# Patient Record
Sex: Female | Born: 1955 | Race: Asian | Hispanic: No | Marital: Married | Smoking: Never smoker
Health system: Southern US, Community
[De-identification: ages and names within clinical notes are randomized; demographics above are authoritative.]

## PROBLEM LIST (undated history)

## (undated) DIAGNOSIS — R059 Cough, unspecified: Secondary | ICD-10-CM

## (undated) DIAGNOSIS — E559 Vitamin D deficiency, unspecified: Secondary | ICD-10-CM

## (undated) DIAGNOSIS — M8580 Other specified disorders of bone density and structure, unspecified site: Secondary | ICD-10-CM

## (undated) DIAGNOSIS — J302 Other seasonal allergic rhinitis: Secondary | ICD-10-CM

## (undated) DIAGNOSIS — R05 Cough: Secondary | ICD-10-CM

## (undated) DIAGNOSIS — T7840XA Allergy, unspecified, initial encounter: Secondary | ICD-10-CM

## (undated) HISTORY — DX: Other seasonal allergic rhinitis: J30.2

## (undated) HISTORY — DX: Cough, unspecified: R05.9

## (undated) HISTORY — DX: Allergy, unspecified, initial encounter: T78.40XA

## (undated) HISTORY — PX: POLYPECTOMY: SHX149

## (undated) HISTORY — DX: Vitamin D deficiency, unspecified: E55.9

## (undated) HISTORY — DX: Other specified disorders of bone density and structure, unspecified site: M85.80

---
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
## 2009-02-13 HISTORY — PX: COLONOSCOPY: SHX174

## 2009-03-18 ENCOUNTER — Encounter: Admission: RE | Admit: 2009-03-18 | Discharge: 2009-03-18 | Payer: Self-pay | Admitting: Family Medicine

## 2009-03-18 DIAGNOSIS — Z0000 Encounter for general adult medical examination without abnormal findings: Secondary | ICD-10-CM | POA: Insufficient documentation

## 2009-03-25 ENCOUNTER — Encounter: Admission: RE | Admit: 2009-03-25 | Discharge: 2009-03-25 | Payer: Self-pay | Admitting: Family Medicine

## 2010-03-06 ENCOUNTER — Encounter: Payer: Self-pay | Admitting: Family Medicine

## 2010-05-13 ENCOUNTER — Other Ambulatory Visit: Payer: Self-pay | Admitting: Family Medicine

## 2010-05-13 DIAGNOSIS — Z1231 Encounter for screening mammogram for malignant neoplasm of breast: Secondary | ICD-10-CM

## 2010-05-27 ENCOUNTER — Ambulatory Visit
Admission: RE | Admit: 2010-05-27 | Discharge: 2010-05-27 | Disposition: A | Discharge status: Home / Self Care | Payer: BC Managed Care – PPO | Source: Ambulatory Visit | Attending: Family Medicine | Admitting: Family Medicine

## 2010-05-27 DIAGNOSIS — Z1231 Encounter for screening mammogram for malignant neoplasm of breast: Secondary | ICD-10-CM

## 2011-06-16 ENCOUNTER — Other Ambulatory Visit: Payer: Self-pay | Admitting: Nurse Practitioner

## 2011-06-16 DIAGNOSIS — Z1231 Encounter for screening mammogram for malignant neoplasm of breast: Secondary | ICD-10-CM

## 2011-06-22 ENCOUNTER — Ambulatory Visit
Admission: RE | Admit: 2011-06-22 | Discharge: 2011-06-22 | Disposition: A | Discharge status: Home / Self Care | Payer: BC Managed Care – PPO | Source: Ambulatory Visit | Attending: Nurse Practitioner | Admitting: Nurse Practitioner

## 2011-06-22 ENCOUNTER — Other Ambulatory Visit: Payer: Self-pay | Admitting: Family Medicine

## 2011-06-22 DIAGNOSIS — Z1231 Encounter for screening mammogram for malignant neoplasm of breast: Secondary | ICD-10-CM

## 2012-07-15 ENCOUNTER — Other Ambulatory Visit: Payer: Self-pay

## 2012-07-15 DIAGNOSIS — Z1231 Encounter for screening mammogram for malignant neoplasm of breast: Secondary | ICD-10-CM

## 2012-08-15 ENCOUNTER — Ambulatory Visit
Admission: RE | Admit: 2012-08-15 | Discharge: 2012-08-15 | Disposition: A | Discharge status: Home / Self Care | Payer: BC Managed Care – PPO | Source: Ambulatory Visit

## 2012-08-15 DIAGNOSIS — Z1231 Encounter for screening mammogram for malignant neoplasm of breast: Secondary | ICD-10-CM

## 2013-09-15 ENCOUNTER — Other Ambulatory Visit: Payer: Self-pay

## 2013-09-15 DIAGNOSIS — Z1231 Encounter for screening mammogram for malignant neoplasm of breast: Secondary | ICD-10-CM

## 2013-09-23 ENCOUNTER — Ambulatory Visit
Admission: RE | Admit: 2013-09-23 | Discharge: 2013-09-23 | Disposition: A | Discharge status: Home / Self Care | Payer: BC Managed Care – PPO | Source: Ambulatory Visit

## 2013-09-23 DIAGNOSIS — Z1231 Encounter for screening mammogram for malignant neoplasm of breast: Secondary | ICD-10-CM

## 2014-08-14 ENCOUNTER — Other Ambulatory Visit: Payer: Self-pay

## 2014-08-14 DIAGNOSIS — Z1231 Encounter for screening mammogram for malignant neoplasm of breast: Secondary | ICD-10-CM

## 2014-09-09 ENCOUNTER — Encounter: Payer: Self-pay | Admitting: Obstetrics and Gynecology

## 2014-09-09 ENCOUNTER — Ambulatory Visit (INDEPENDENT_AMBULATORY_CARE_PROVIDER_SITE_OTHER): Payer: 59 | Admitting: Obstetrics and Gynecology

## 2014-09-09 VITALS — BP 100/60 | HR 84 | Resp 14 | Ht 65.5 in | Wt 127.0 lb

## 2014-09-09 DIAGNOSIS — N393 Stress incontinence (female) (male): Secondary | ICD-10-CM | POA: Diagnosis not present

## 2014-09-09 DIAGNOSIS — IMO0002 Reserved for concepts with insufficient information to code with codable children: Secondary | ICD-10-CM

## 2014-09-09 DIAGNOSIS — Z124 Encounter for screening for malignant neoplasm of cervix: Secondary | ICD-10-CM

## 2014-09-09 DIAGNOSIS — N941 Dyspareunia: Secondary | ICD-10-CM | POA: Diagnosis not present

## 2014-09-09 DIAGNOSIS — Z23 Encounter for immunization: Secondary | ICD-10-CM | POA: Diagnosis not present

## 2014-09-09 DIAGNOSIS — Z01419 Encounter for gynecological examination (general) (routine) without abnormal findings: Secondary | ICD-10-CM | POA: Diagnosis not present

## 2014-09-09 DIAGNOSIS — N952 Postmenopausal atrophic vaginitis: Secondary | ICD-10-CM | POA: Diagnosis not present

## 2014-09-09 DIAGNOSIS — Z1211 Encounter for screening for malignant neoplasm of colon: Secondary | ICD-10-CM | POA: Diagnosis not present

## 2014-09-09 MED ORDER — ESTRADIOL 0.1 MG/GM VA CREA: TOPICAL_CREAM | VAGINAL | Status: DC

## 2014-09-09 NOTE — Patient Instructions (Signed)
Kegel Exercises The goal of Kegel exercises is to isolate and exercise your pelvic floor muscles. These muscles act as a hammock that supports the rectum, vagina, small intestine, and uterus. As the muscles weaken, the hammock sags and these organs are displaced from their normal positions. Kegel exercises can strengthen your pelvic floor muscles and help you to improve bladder and bowel control, improve sexual response, and help reduce many problems and some discomfort during pregnancy. Kegel exercises can be done anywhere and at any time. HOW TO PERFORM KEGEL EXERCISES 1. Locate your pelvic floor muscles. To do this, squeeze (contract) the muscles that you use when you try to stop the flow of urine. You will feel a tightness in the vaginal area (women) and a tight lift in the rectal area (men and women). 2. When you begin, contract your pelvic muscles tight for 2-5 seconds, then relax them for 2-5 seconds. This is one set. Do 4-5 sets with a short pause in between. 3. Contract your pelvic muscles for 8-10 seconds, then relax them for 8-10 seconds. Do 4-5 sets. If you cannot contract your pelvic muscles for 8-10 seconds, try 5-7 seconds and work your way up to 8-10 seconds. Your goal is 4-5 sets of 10 contractions each day. Keep your stomach, buttocks, and legs relaxed during the exercises. Perform sets of both short and long contractions. Vary your positions. Perform these contractions 3-4 times per day. Perform sets while you are:   Lying in bed in the morning.  Standing at lunch.  Sitting in the late afternoon.  Lying in bed at night. You should do 40-50 contractions per day. Do not perform more Kegel exercises per day than recommended. Overexercising can cause muscle fatigue. Continue these exercises for for at least 15-20 weeks or as directed by your caregiver. Document Released: 01/17/2012 Document Reviewed: 01/17/2012 [facility] Patient Information 2015 [place], [place]. This information is  not intended to replace advice given to you by your health care provider. Make sure you discuss any questions you have with your health care provider.  Call if you want to see physical therapy for your incontinence  Use a lubricant with intercourse

## 2014-09-09 NOTE — Progress Notes (Signed)
Patient ID: Margaret Conway, female   DOB: 1955-07-17, 59 y.o.   MRN: [id number] 59 y.o. [id number] Married Asian female here for annual exam.  Pt c/o urinary leakage when she coughs. She has bad allergies, she leaks with a full bladder and cough or laughing. She leaks every day, worse the last few weeks with worsening allergies. She leaks small to moderate amounts, gets her underwear wet. She hasn't been wearing a pad. She has tried kegels a little, not regularly, not helping.  PMP without bleeding. Sexually active, has dryness and entry discomfort. She has tried lubricant, doesn't help much.   PCP:  [facility]   No LMP recorded. Patient is postmenopausal.          Sexually active: Yes.    The current method of family planning is post menopausal status.    Exercising: Yes.    walking Smoker:  no  Health Maintenance: Pap:  2013 WNL  History of abnormal Pap:  no MMG:  09-23-13 WNL  Colonoscopy:  2011 polyps repeat in 5 years, due this year BMD:   2011  Result  WNL TDaP:  Unsure, thinks do Screening Labs:  Hb today: PCP does labs    reports that she has never smoked. She has never used smokeless tobacco. She reports that she does not drink alcohol or use illicit drugs.  Past Medical History  Diagnosis Date  . Seasonal allergies     History reviewed. No pertinent past surgical history.  Current Outpatient Prescriptions  Medication Sig Dispense Refill  . ipratropium (ATROVENT) 0.03 % nasal spray 2 SPRAYS IN EACH NOSTRIL TWICE A DAY  5   No current facility-administered medications for this visit.    Family History  Problem Relation Age of Onset  . Hypertension Mother   . Hypertension Father   . Kidney disease Brother    SH: 3 FTNSVD. Kids are 33,32, 16. 3 grandchildren.  ROS:  Pertinent items are noted in HPI.  Otherwise, a comprehensive ROS was negative.  Exam:   BP 100/60 mmHg  Pulse 84  Resp 14  Ht 5' 5.5" (1.664 m)  Wt 127 lb (57.607 kg)  BMI 20.81 kg/m2    General  appearance: alert, cooperative and appears stated age Head: Normocephalic, without obvious abnormality, atraumatic Neck: no adenopathy, supple, symmetrical, trachea midline and thyroid normal to inspection and palpation Lungs: clear to auscultation bilaterally Breasts: normal appearance, no masses or tenderness Heart: regular rate and rhythm Abdomen: soft, non-tender; bowel sounds normal; no masses,  no organomegaly Extremities: extremities normal, atraumatic, no cyanosis or edema Skin: Skin color, texture, turgor normal. No rashes or lesions Lymph nodes: Cervical, supraclavicular, and axillary nodes normal. No abnormal inguinal nodes palpated Neurologic: Grossly normal  Pelvic: External genitalia:  no lesions, atrophic              Urethra:  normal appearing urethra with no masses, tenderness or lesions              Bartholins and Skenes: normal                 Vagina: very atrophic appearing vagina, normal caliber              Cervix: no lesions              Pap taken: Yes.   Bimanual Exam:  Uterus:  normal size, contour, position, consistency, mobility, non-tender              Adnexa: normal  adnexa and no mass, fullness, tenderness              Rectovaginal: Yes.  .  Confirms.              Anus:  normal sphincter tone, no lesions  Chaperone was present for exam.  Assessment:   Well woman visit with normal exam. Vaginal atrophy Entry dyspareunia GSI, discussed options of trying Kegels, physical therapy and surgery. She would like to try Kegels H/O colon polyps, due for colonoscopy  Plan: Yearly mammogram recommended after age 59. Due in August Recommended and reviewed self breast exam.  Pap and HR HPV as above. Discussed Calcium, Vitamin D, regular exercise program including cardiovascular and weight bearing exercise. Discussed lubrication with intercourse Start vaginal estrogen Kegel information given, she will call if she wants to start PT Send urine for ua, c&s f/u in 3  months TDAP today Set up colonoscopy  After visit summary provided.     Cc: [facility]

## 2014-09-10 LAB — URINALYSIS, MICROSCOPIC ONLY
BACTERIA UA: NONE SEEN [HPF]
Casts: NONE SEEN [LPF]
Crystals: NONE SEEN [HPF]
RBC / HPF: NONE SEEN RBC/HPF (ref <=2)
SQUAMOUS EPITHELIAL / LPF: NONE SEEN [HPF] (ref <=5)
WBC, UA: NONE SEEN WBC/HPF (ref <=5)
YEAST: NONE SEEN [HPF]

## 2014-09-11 ENCOUNTER — Telehealth: Payer: Self-pay | Admitting: Obstetrics and Gynecology

## 2014-09-11 LAB — URINE CULTURE
Colony Count: NO GROWTH
Organism ID, Bacteria: NO GROWTH

## 2014-09-11 MED ORDER — ESTRADIOL 0.1 MG/GM VA CREA: TOPICAL_CREAM | VAGINAL | Status: DC

## 2014-09-11 NOTE — Telephone Encounter (Signed)
Patient says [facility] did not have estradiol and she would like this called to [facility]. @ [phone]

## 2014-09-11 NOTE — Telephone Encounter (Signed)
09/09/14 Estrace Cream #42.5 gm was sent to [facility]; sent through new rx to [facility] on [address], patient is aware.

## 2014-09-12 LAB — IPS PAP TEST WITH HPV

## 2014-09-16 ENCOUNTER — Telehealth: Payer: Self-pay

## 2014-09-16 NOTE — Telephone Encounter (Signed)
Notes Recorded by Romualdo Bolk, MD on 09/14/2014 at 10:32 AM 02   02 recall entered.  Routing to provider for final review. Patient agreeable to disposition. Will close encounter.   Patient aware provider will review message and nurse will return call if any additional advice or change of disposition.

## 2014-09-23 ENCOUNTER — Ambulatory Visit: Payer: Self-pay | Admitting: Obstetrics and Gynecology

## 2014-09-29 ENCOUNTER — Ambulatory Visit: Payer: Self-pay

## 2014-10-02 ENCOUNTER — Ambulatory Visit
Admission: RE | Admit: 2014-10-02 | Discharge: 2014-10-02 | Disposition: A | Discharge status: Home / Self Care | Payer: 59 | Source: Ambulatory Visit

## 2014-10-02 DIAGNOSIS — Z1231 Encounter for screening mammogram for malignant neoplasm of breast: Secondary | ICD-10-CM

## 2014-11-09 ENCOUNTER — Telehealth: Payer: Self-pay | Admitting: Obstetrics and Gynecology

## 2014-11-09 NOTE — Telephone Encounter (Signed)
Patient canceled her 3 month follow up appointment 12/10/14 and will call later to reschedule.

## 2014-12-10 ENCOUNTER — Ambulatory Visit: Payer: 59 | Admitting: Obstetrics and Gynecology

## 2014-12-12 ENCOUNTER — Other Ambulatory Visit: Payer: Self-pay | Admitting: Obstetrics and Gynecology

## 2014-12-14 NOTE — Telephone Encounter (Signed)
Medication refill request: estrace vaginal cream  Last AEX:  09/09/14 JJ Next AEX: none Last MMG (if hormonal medication request): 10/02/14 [id number]:neg Refill authorized: 09/11/14 #42.5g/ 1 refill. Today please advise.

## 2015-07-30 ENCOUNTER — Other Ambulatory Visit: Payer: Self-pay | Admitting: Physician Assistant

## 2015-07-30 DIAGNOSIS — R9389 Abnormal findings on diagnostic imaging of other specified body structures: Secondary | ICD-10-CM

## 2015-07-30 DIAGNOSIS — R911 Solitary pulmonary nodule: Secondary | ICD-10-CM

## 2015-08-09 ENCOUNTER — Other Ambulatory Visit: Payer: Self-pay

## 2015-09-03 ENCOUNTER — Other Ambulatory Visit: Payer: Self-pay | Admitting: Physician Assistant

## 2015-09-03 DIAGNOSIS — Z1231 Encounter for screening mammogram for malignant neoplasm of breast: Secondary | ICD-10-CM

## 2015-10-04 ENCOUNTER — Ambulatory Visit
Admission: RE | Admit: 2015-10-04 | Discharge: 2015-10-04 | Disposition: A | Discharge status: Home / Self Care | Payer: BC Managed Care – PPO | Source: Ambulatory Visit | Attending: Physician Assistant | Admitting: Physician Assistant

## 2015-10-04 DIAGNOSIS — Z1231 Encounter for screening mammogram for malignant neoplasm of breast: Secondary | ICD-10-CM

## 2016-01-19 ENCOUNTER — Other Ambulatory Visit: Payer: Self-pay | Admitting: Nurse Practitioner

## 2016-01-19 DIAGNOSIS — R05 Cough: Secondary | ICD-10-CM

## 2016-01-19 DIAGNOSIS — R053 Chronic cough: Secondary | ICD-10-CM

## 2016-02-16 ENCOUNTER — Other Ambulatory Visit: Payer: BC Managed Care – PPO

## 2016-10-10 ENCOUNTER — Other Ambulatory Visit: Payer: Self-pay | Admitting: Nurse Practitioner

## 2016-10-10 DIAGNOSIS — Z1231 Encounter for screening mammogram for malignant neoplasm of breast: Secondary | ICD-10-CM

## 2016-10-12 ENCOUNTER — Other Ambulatory Visit: Payer: Self-pay | Admitting: Nurse Practitioner

## 2016-10-12 ENCOUNTER — Ambulatory Visit: Payer: BC Managed Care – PPO

## 2016-10-12 DIAGNOSIS — E2839 Other primary ovarian failure: Secondary | ICD-10-CM

## 2016-10-27 ENCOUNTER — Ambulatory Visit
Admission: RE | Admit: 2016-10-27 | Discharge: 2016-10-27 | Disposition: A | Discharge status: Home / Self Care | Payer: BC Managed Care – PPO | Source: Ambulatory Visit | Attending: Nurse Practitioner | Admitting: Nurse Practitioner

## 2016-10-27 DIAGNOSIS — Z1231 Encounter for screening mammogram for malignant neoplasm of breast: Secondary | ICD-10-CM

## 2016-10-27 DIAGNOSIS — E2839 Other primary ovarian failure: Secondary | ICD-10-CM

## 2016-10-31 ENCOUNTER — Ambulatory Visit: Payer: BC Managed Care – PPO

## 2016-10-31 ENCOUNTER — Other Ambulatory Visit: Payer: BC Managed Care – PPO

## 2017-09-24 ENCOUNTER — Other Ambulatory Visit: Payer: Self-pay | Admitting: Family Medicine

## 2017-09-24 DIAGNOSIS — R9389 Abnormal findings on diagnostic imaging of other specified body structures: Secondary | ICD-10-CM

## 2017-09-28 ENCOUNTER — Ambulatory Visit
Admission: RE | Admit: 2017-09-28 | Discharge: 2017-09-28 | Disposition: A | Discharge status: Home / Self Care | Payer: BC Managed Care – PPO | Source: Ambulatory Visit | Attending: Family Medicine | Admitting: Family Medicine

## 2017-09-28 DIAGNOSIS — R9389 Abnormal findings on diagnostic imaging of other specified body structures: Secondary | ICD-10-CM

## 2017-10-04 ENCOUNTER — Encounter: Payer: Self-pay | Admitting: Internal Medicine

## 2017-10-04 ENCOUNTER — Ambulatory Visit: Payer: BC Managed Care – PPO | Admitting: Internal Medicine

## 2017-10-04 ENCOUNTER — Ambulatory Visit (INDEPENDENT_AMBULATORY_CARE_PROVIDER_SITE_OTHER)
Admission: RE | Admit: 2017-10-04 | Discharge: 2017-10-04 | Disposition: A | Discharge status: Home / Self Care | Payer: BC Managed Care – PPO | Source: Ambulatory Visit | Attending: Internal Medicine | Admitting: Internal Medicine

## 2017-10-04 ENCOUNTER — Other Ambulatory Visit (INDEPENDENT_AMBULATORY_CARE_PROVIDER_SITE_OTHER): Payer: BC Managed Care – PPO

## 2017-10-04 VITALS — BP 126/74 | HR 91 | Ht 66.0 in | Wt 131.4 lb

## 2017-10-04 DIAGNOSIS — J479 Bronchiectasis, uncomplicated: Secondary | ICD-10-CM | POA: Diagnosis not present

## 2017-10-04 DIAGNOSIS — R058 Other specified cough: Secondary | ICD-10-CM

## 2017-10-04 DIAGNOSIS — R05 Cough: Secondary | ICD-10-CM | POA: Diagnosis not present

## 2017-10-04 LAB — CBC WITH DIFFERENTIAL/PLATELET
BASOS PCT: 0.8 % (ref 0.0–3.0)
Basophils Absolute: 0 10*3/uL (ref 0.0–0.1)
EOS ABS: 0.1 10*3/uL (ref 0.0–0.7)
EOS PCT: 2 % (ref 0.0–5.0)
HEMATOCRIT: 41.3 % (ref 36.0–46.0)
HEMOGLOBIN: 13.6 g/dL (ref 12.0–15.0)
LYMPHS PCT: 36.9 % (ref 12.0–46.0)
Lymphs Abs: 2.1 10*3/uL (ref 0.7–4.0)
MCHC: 33 g/dL (ref 30.0–36.0)
MCV: 93.2 fl (ref 78.0–100.0)
Monocytes Absolute: 0.6 10*3/uL (ref 0.1–1.0)
Monocytes Relative: 9.9 % (ref 3.0–12.0)
NEUTROS ABS: 2.8 10*3/uL (ref 1.4–7.7)
Neutrophils Relative %: 50.4 % (ref 43.0–77.0)
PLATELETS: 222 10*3/uL (ref 150.0–400.0)
RBC: 4.43 Mil/uL (ref 3.87–5.11)
RDW: 12.7 % (ref 11.5–15.5)
WBC: 5.6 10*3/uL (ref 4.0–10.5)

## 2017-10-04 LAB — SEDIMENTATION RATE: Sed Rate: 120 mm/hr — ABNORMAL HIGH (ref 0–30)

## 2017-10-04 MED ORDER — LEVOFLOXACIN 500 MG PO TABS: 500.0000 mg | ORAL_TABLET | Freq: Every day | ORAL | 0 refills | Status: DC

## 2017-10-04 NOTE — Progress Notes (Addendum)
Subjective:    Patient ID: Margaret Conway, female    DOB: 04-12-1955, 62 y.o.   MRN: [id number]  HPI  62 yo [place] female from [place] never smoker with h/o seasonal rhinitis(pollen) with pollen since living in [place]  around early 2000 assoc with some intermittent coughing > ENT eval x 2 > one here/ one in [facility] "never did allergy testing" and referred to pulmonary clinic 10/04/2017 by Dr   Via for cough that has persisted daily  x 2016 assoc with abn ct chest 09/28/17 nodular lung dz assoc with bronchiectasis R > L.    10/04/2017 1st [facility] office visit/ Wert   Chief Complaint  Patient presents with  . Pulmonary Consult    refeerred by PCP for cough, she has been coughing for 2-3 years, had a CT scan and wants to discuss results  indolent onset persistent daily cough x 2.5 years worse at hs esp since summer 2019 productive variably productive green mucus no better with zpak  Worse in am also but no assoc wheeze/ sob or cp of any kind nor fever/ ns/ wt loss    No obvious day to day or daytime variability or assoc   mucus plugs or hemoptysis or   chest tightness, subjective wheeze or overt sinus or hb symptoms.    Also denies any obvious fluctuation of symptoms with weather or environmental changes or other aggravating or alleviating factors except as outlined above   No unusual exposure hx or h/o childhood pna/ asthma or knowledge of premature birth.  Current Allergies, Complete Past Medical History, Past Surgical History, Family History, and Social History were reviewed in [employer] [facility] record.     Current Meds  Medication Sig  . ipratropium (ATROVENT) 0.03 % nasal spray 2 SPRAYS IN EACH NOSTRIL TWICE A DAY  . omeprazole (PRILOSEC) 20 MG capsule Take 20 mg by mouth daily.  . [DISCONTINUED] estradiol (ESTRACE VAGINAL) 0.1 MG/GM vaginal cream Use 1 gram vaginally 2 x a week at bedtime           Review of Systems  Constitutional:  Negative for fever and unexpected weight change.  HENT: Negative for congestion, dental problem, ear pain, nosebleeds, postnasal drip, rhinorrhea, sinus pressure, sneezing, sore throat and trouble swallowing.   Eyes: Negative for redness and itching.  Respiratory: Positive for cough. Negative for chest tightness, shortness of breath and wheezing.   Cardiovascular: Negative for palpitations and leg swelling.  Gastrointestinal: Negative for nausea and vomiting.  Genitourinary: Negative for dysuria.  Musculoskeletal: Negative for joint swelling.  Skin: Negative for rash.  Neurological: Negative for headaches.  Hematological: Does not bruise/bleed easily.  Psychiatric/Behavioral: Negative for dysphoric mood. The patient is not nervous/anxious.        Objective:   Physical Exam  Pleasant amb healthy appearing female nad   Wt Readings from Last 3 Encounters:  10/04/17 131 lb 6.4 oz (59.6 kg)  09/09/14 127 lb (57.6 kg)     Vital signs reviewed - Note on arrival 02 sats  97% on RA      HEENT: nl dentition, turbinates bilaterally, and oropharynx. Nl external ear canals without cough reflex   NECK :  without JVD/Nodes/TM/ nl carotid upstrokes bilaterally   LUNGS: no acc muscle use,  Nl contour chest with minimal insp/exp rhonchi bilaterally without cough on insp or exp maneuvers   CV:  RRR  no s3 or murmur or increase in P2, and no edema   ABD:  soft and nontender with nl inspiratory excursion in the supine position. No bruits or organomegaly appreciated, bowel sounds nl  MS:  Nl gait/ ext warm without deformities, calf tenderness, cyanosis or clubbing No obvious joint restrictions   SKIN: warm and dry without lesions    NEURO:  alert, approp, nl sensorium with  no motor or cerebellar deficits apparent.    Labs ordered 10/04/2017    Quant immunoglobulins, allergy profile     Lab Results  Component Value Date   ESRSEDRATE 120 (H) 10/04/2017      I personally reviewed  images and agree with radiology impression as follows:   Chest CT w/o contrast  09/28/17 Lungs are adequately inflated and demonstrate a patchy bilateral nodular airspace process right worse than left. There are associated patchy areas of bronchiectasis with thickened bronchial walls and distal mucous plugging. Few small calcified granulomas are present bilaterally. No evidence of pleural effusion. 1.7 cm focus of opacification over the right middle lobe with somewhat linear morphology.   CXR PA and Lateral:   10/04/2017 :    I personally reviewed images and agree with radiology impression as follows:   Bronchiectatic markings in the right middle lobe and lingula. Few scattered patchy pulmonary opacities elsewhere, largest 16 mm in the right upper lobe. These findings are all similar to the CT done 6 days ago. Recommendation at that time was for follow-up CT in 4-6 weeks.     Assessment & Plan:

## 2017-10-04 NOTE — Patient Instructions (Addendum)
Bronchiectasis =   you have scarring of your bronchial tubes which means that they don't function perfectly normally and mucus tends to pool in certain areas of your lung which can cause pneumonia and further scarring of your lung and bronchial tubes  Whenever you develop cough congestion take mucinex or mucinex dm 1200 mg every 12 hours> these will help keep the mucus loose and flowing but if your condition worsens you need to seek help immediately preferably here or somewhere inside the [facility] system to compare xrays ( worse = darker or bloody mucus or pain on breathing in)     Please remember to go to the lab and x-ray department downstairs in the basement  for your tests - we will call you with the results when they are available.  levaquin 500 mg daily x 10 days       Please schedule a follow up office visit in 2  weeks, sooner if needed

## 2017-10-05 ENCOUNTER — Encounter: Payer: Self-pay | Admitting: Internal Medicine

## 2017-10-05 DIAGNOSIS — R05 Cough: Secondary | ICD-10-CM | POA: Insufficient documentation

## 2017-10-05 DIAGNOSIS — R058 Other specified cough: Secondary | ICD-10-CM | POA: Insufficient documentation

## 2017-10-05 NOTE — Assessment & Plan Note (Addendum)
Allergy profile 10/04/2017 >  Eos 0.1 /  IgE pending   Sinus CT 10/05/2017 >>>    Assoc with bronchiectasis so need to exclude chronic sinusitis and also continue to suppress stomach acid with just daily prilosec for now given assoc between gerd and bronchiectasis/ chronic cough

## 2017-10-05 NOTE — Assessment & Plan Note (Addendum)
-   Quant Ig Profile 10/04/2017 = - Percival Spanish TB 10/04/2017 =  - ESR 10/04/2017 = 120   >> rx levaquin 500 mg daily x 10 days then repeat cxr as changes are obvious on plain cxr     This is an relatively  common and usually benign condition in female pts her age  and does not necessarily    warrant aggressive eval/ rx at this point unless there is a clinical correlation suggesting unaddressed pulmonary infection (persistent - ie p levaquin-  purulent sputum, night sweats, unintended wt loss, doe) or evolution of  obvious changes on plain cxr (as opposed to serial CT, which is way over sensitive to make clinical decisions re intervention and treatments which tend to be long and poorly tolerated ).    Most likely she will need fob /bal of RML to look for MAI or organisms resistant to levquin  but too soon to tell and she has a good baseline esr/cxr to follow s putting her thru multiple CT's to sort this out.    Discussed in detail all the  indications, usual  risks and alternatives  relative to the benefits with patient who agrees to proceed with w/u and rx  as outlined     Total time devoted to counseling  > 50 % of initial 60 min office visit:  review case with pt/ discussion of options/alternatives/ personally creating written customized instructions  in presence of pt  then going over those specific  Instructions directly with the pt including how to use all of the meds but in particular covering each new medication in detail and the difference between the maintenance= "automatic" meds and the prns using an action plan format for the latter (If this problem/symptom => do that organization reading Left to right).  Please see AVS from this visit for a full list of these instructions which I personally wrote for this pt and  are unique to this visit.

## 2017-10-06 LAB — RESPIRATORY ALLERGY PROFILE REGION II ~~LOC~~
ALLERGEN, COMM SILVER BIRCH, T3: 0.92 kU/L — AB
Allergen, A. alternata, m6: <0.1 kU/L
Allergen, D pternoyssinus,d7: <0.1 kU/L
Allergen, Mouse Urine Protein, e78: <0.1 kU/L
Allergen, Oak,t7: 2 kU/L — ABNORMAL HIGH
Allergen, P. notatum, m1: <0.1 kU/L
BERMUDA GRASS: 11.7 kU/L — AB
Box Elder IgE: 0.13 kU/L — ABNORMAL HIGH
CLASS: 0
CLASS: 0
CLASS: 0
CLASS: 0
CLASS: 0
CLASS: 0
CLASS: 0
CLASS: 0
CLASS: 2
COMMON RAGWEED (SHORT) (W1) IGE: 0.24 kU/L — AB
Class: 0
Class: 0
Class: 0
Class: 0
Class: 0
Class: 0
Class: 0
Class: 0
Class: 0
Class: 0
Class: 0
Class: 2
Class: 3
Class: 4
Class: 4
Cockroach: <0.1 kU/L
D. farinae: <0.1 kU/L
DOG DANDER: 0.19 kU/L — AB
IGE (IMMUNOGLOBULIN E), SERUM: 121 kU/L — AB (ref <=114)
JOHNSON GRASS: 23.4 kU/L — AB
Sheep Sorrel IgE: <0.1 kU/L
Timothy Grass: 38.9 kU/L — ABNORMAL HIGH

## 2017-10-06 LAB — QUANTIFERON-TB GOLD PLUS
Mitogen-NIL: >10 IU/mL
NIL: 0.15 IU/mL
QUANTIFERON-TB GOLD PLUS: NEGATIVE
TB1-NIL: 0.08 [IU]/mL
TB2-NIL: 0.07 [IU]/mL

## 2017-10-06 LAB — IGG, IGA, IGM
IgG (Immunoglobin G), Serum: 1997 mg/dL — ABNORMAL HIGH (ref 600–1540)
IgM, Serum: 55 mg/dL (ref 50–300)
Immunoglobulin A: 611 mg/dL — ABNORMAL HIGH (ref 20–320)

## 2017-10-06 LAB — INTERPRETATION:

## 2017-10-09 ENCOUNTER — Ambulatory Visit (INDEPENDENT_AMBULATORY_CARE_PROVIDER_SITE_OTHER)
Admission: RE | Admit: 2017-10-09 | Discharge: 2017-10-09 | Disposition: A | Discharge status: Home / Self Care | Payer: BC Managed Care – PPO | Source: Ambulatory Visit | Attending: Internal Medicine | Admitting: Internal Medicine

## 2017-10-09 DIAGNOSIS — J479 Bronchiectasis, uncomplicated: Secondary | ICD-10-CM

## 2017-10-09 NOTE — Progress Notes (Signed)
Spoke with pt and notified of results per Dr. Wert. Pt verbalized understanding and denied any questions. 

## 2017-10-16 ENCOUNTER — Other Ambulatory Visit: Payer: Self-pay | Admitting: Family Medicine

## 2017-10-16 DIAGNOSIS — Z1231 Encounter for screening mammogram for malignant neoplasm of breast: Secondary | ICD-10-CM

## 2017-10-18 ENCOUNTER — Ambulatory Visit: Payer: BC Managed Care – PPO | Admitting: Internal Medicine

## 2017-10-18 ENCOUNTER — Encounter: Payer: Self-pay | Admitting: Internal Medicine

## 2017-10-18 ENCOUNTER — Ambulatory Visit (INDEPENDENT_AMBULATORY_CARE_PROVIDER_SITE_OTHER)
Admission: RE | Admit: 2017-10-18 | Discharge: 2017-10-18 | Disposition: A | Discharge status: Home / Self Care | Payer: BC Managed Care – PPO | Source: Ambulatory Visit | Attending: Internal Medicine | Admitting: Internal Medicine

## 2017-10-18 VITALS — BP 104/70 | HR 72 | Ht 66.0 in | Wt 126.6 lb

## 2017-10-18 DIAGNOSIS — R05 Cough: Secondary | ICD-10-CM | POA: Diagnosis not present

## 2017-10-18 DIAGNOSIS — J479 Bronchiectasis, uncomplicated: Secondary | ICD-10-CM

## 2017-10-18 DIAGNOSIS — Z23 Encounter for immunization: Secondary | ICD-10-CM | POA: Diagnosis not present

## 2017-10-18 DIAGNOSIS — R058 Other specified cough: Secondary | ICD-10-CM

## 2017-10-18 MED ORDER — FLUTTER DEVI: 0 refills | Status: DC

## 2017-10-18 MED ORDER — LEVOFLOXACIN 500 MG PO TABS: ORAL_TABLET | ORAL | 5 refills | Status: DC

## 2017-10-18 NOTE — Progress Notes (Signed)
Subjective:    Patient ID: Margaret [facility], female    DOB: 18-Feb-1955    MRN: [id number]    Brief patient profile:  62 yo [place] female from [place] never smoker with h/o seasonal rhinitis(pollen) with pollen since living in [place]  around early 2000 assoc with some intermittent coughing > ENT eval x 2 > one here/ one in [facility] "never did allergy testing" and referred to pulmonary clinic 10/04/2017 by Dr   Via for cough that has persisted daily  x 2016 assoc with abn ct chest 09/28/17 nodular lung dz assoc with bronchiectasis R > L.     Marland KitchenHistory of Present Illness  10/04/2017 1st [facility] office visit/ Lorea Kupfer   Chief Complaint  Patient presents with  . Pulmonary Consult    refeerred by PCP for cough, she has been coughing for 2-3 years, had a CT scan and wants to discuss results  indolent onset persistent daily cough x 2.5 years worse at hs esp since summer 2019 productive variably productive green mucus no better with zpak  Worse in am also but no assoc wheeze/ sob or cp of any kind nor fever/ ns/ wt loss  rec Bronchiectasis =   you have scarring of your bronchial tubes   Whenever you develop cough congestion take mucinex or mucinex dm 1200 mg every 12 hours> these will help keep the mucus loose and flowing but if your condition worsens you need to seek help immediately preferably here or somewhere inside the [facility] system to compare xrays ( worse = darker or bloody mucus or pain on breathing in)   levaquin 500 mg daily x 10 days     10/18/2017  f/u ov/Michaeljames Milnes re: bronchiectasis  Chief Complaint  Patient presents with  . Follow-up    Doing much better  some cough at night clear mucus.  Dyspnea:  Not limited by breathing from desired activities   Cough: much lesss /min mucoid at hs and in am  Sleeping: ok one pillow  SABA use: none 02: none     No obvious day to day or daytime variability or assoc   purulent sputum or mucus plugs or hemoptysis or cp or chest tightness,  subjective wheeze or overt sinus or hb symptoms.    Also denies any obvious fluctuation of symptoms with weather or environmental changes or other aggravating or alleviating factors except as outlined above   No unusual exposure hx or h/o childhood pna/ asthma or knowledge of premature birth.  Current Allergies, Complete Past Medical History, Past Surgical History, Family History, and Social History were reviewed in [employer] [facility] record.  ROS  The following are not active complaints unless bolded Hoarseness, sore throat, dysphagia, dental problems, itching, sneezing,  nasal congestion or discharge of excess mucus or purulent secretions, ear ache,   fever, chills, sweats, unintended wt loss or wt gain, classically pleuritic or exertional cp,  orthopnea pnd or arm/hand swelling  or leg swelling, presyncope, palpitations, abdominal pain, anorexia, nausea, vomiting, diarrhea  or change in bowel habits or change in bladder habits, change in stools or change in urine, dysuria, hematuria,  rash, arthralgias, visual complaints, headache, numbness, weakness or ataxia or problems with walking or coordination,  change in mood or  memory.        Current Meds  Medication Sig  . dextromethorphan-guaiFENesin (MUCINEX DM) 30-600 MG 12hr tablet Take 2 tablets by mouth 2 (two) times daily.  . [DISCONTINUED] omeprazole (PRILOSEC) 20 MG capsule Take  20 mg by mouth daily.                         Objective:   Physical Exam   pleasant amb asian female nad    10/18/2017         126   10/04/17 131 lb 6.4 oz (59.6 kg)  09/09/14 127 lb (57.6 kg)      Vital signs reviewed - Note on arrival 02 sats  98% on RA     HEENT: nl dentition, turbinates bilaterally, and oropharynx. Nl external ear canals without cough reflex   NECK :  without JVD/Nodes/TM/ nl carotid upstrokes bilaterally   LUNGS: no acc muscle use,  Nl contour chest which is clear to A and P bilaterally without cough  on insp or exp maneuvers   CV:  RRR  no s3 or murmur or increase in P2, and no edema   ABD:  soft and nontender with nl inspiratory excursion in the supine position. No bruits or organomegaly appreciated, bowel sounds nl  MS:  Nl gait/ ext warm without deformities, calf tenderness, cyanosis or clubbing No obvious joint restrictions   SKIN: warm and dry without lesions    NEURO:  alert, approp, nl sensorium with  no motor or cerebellar deficits apparent.    CXR PA and Lateral:   10/18/2017 :    I personally reviewed images and agree with radiology impression as follows:   Hazy RIGHT mid lung infiltrates question pneumonia.  Previously identified nodular density is not seen on the current study, though this could be obscured by infiltrate; follow-up chest radiographs recommended in 4-8 weeks to ensure complete resolution and exclude underlying pulmonary nodule. My impression : vs prior studies clearly improved             Assessment & Plan:

## 2017-10-18 NOTE — Patient Instructions (Signed)
Bronchiectasis =   you have scarring of your bronchial tubes which means that they don't function perfectly normally and mucus tends to pool in certain areas of your lung which can cause pneumonia and further scarring of your lung and bronchial tubes  Whenever you develop cough congestion take mucinex or mucinex dm 1200 mg every 12 hours and use the flutter valve as much as possible   Whenever your mucus turns nasty  > levaquin 500 mg daily x 10 days   Please remember to go to the  x-ray department downstairs in the basement  for your tests - we will call you with the results when they are available.   Please schedule a follow up office visit in 6 weeks, call sooner if needed with pfts on return

## 2017-10-19 ENCOUNTER — Telehealth: Payer: Self-pay | Admitting: Internal Medicine

## 2017-10-19 NOTE — Telephone Encounter (Signed)
Spoke with patient, informed patient of CXR results per MD Sherene Sires, patient states she can see the report on my chart and the report says there is a question of PNE. She states she just completed a course of antibiotics and thought maybe she should go back on them. Patient would like recommendations or better explanation from MD [facility].   MW please advise. Thanks.

## 2017-10-19 NOTE — Progress Notes (Signed)
Left detailed msg on machine ok per DPR

## 2017-10-19 NOTE — Telephone Encounter (Signed)
Explained reviewed - most worrisome area is better, no change in recs and will always have some residual changes on cxr that are very easily interpreted as pna so can't just go by radiology interpretation

## 2017-10-20 ENCOUNTER — Encounter: Payer: Self-pay | Admitting: Internal Medicine

## 2017-10-20 NOTE — Assessment & Plan Note (Signed)
Onset of cough 2016  - Quant Ig Profile 10/04/2017 = nl  - Quant Gold TB 10/04/2017 =  Neg - ESR 10/04/2017 = 120   >> rx levaquin 500 mg daily x 10 days then repeat cxr as changes are obvious on plain cxr > improved 10/18/2017  - flutter valve instructions 10/18/2017    I had an extended discussion with the patient reviewing all relevant studies completed to date and  lasting 15 to 20 minutes of a 25 minute visit on the following ongoing concerns:   1) reviewed again the pathophysiology of bronchiecatsis which is permanent dysfunction of her mc "escalators" using dept store analogy  2) cxr will always be abn and the goal is not to normalize it but to assure there is no progression of dz and control the symptoms/ treat the flares   3) flutter valve use / mucinex dm reviewed  4) for flares of cough also should suppress stomach acid since cough /reflux and bronchiectasis are all assoc though not necessarily by cuse and affect     F/u with pfts in 6 weeks planned   Each maintenance medication was reviewed in detail including most importantly the difference between maintenance and as needed and under what circumstances the prns are to be used.  Please see AVS for specific  Instructions which are unique to this visit and I personally typed out  which were reviewed in detail in writing with the patient and a copy provided.    See device teaching which extended face to face time for this visit

## 2017-10-20 NOTE — Assessment & Plan Note (Signed)
Allergy profile 10/04/2017 >  Eos 0.1 /  IgE  121  RAST pos ragweed, grass, dog trees  Sinus CT 10/09/2017 >>>  Min L sphenoid chronic changes  May be difficult to tease out uacs from bronchiectasis cough, for now ok to just use otc antihistamines

## 2017-11-15 ENCOUNTER — Ambulatory Visit
Admission: RE | Admit: 2017-11-15 | Discharge: 2017-11-15 | Disposition: A | Discharge status: Home / Self Care | Payer: BC Managed Care – PPO | Source: Ambulatory Visit | Attending: Family Medicine | Admitting: Family Medicine

## 2017-11-15 DIAGNOSIS — Z1231 Encounter for screening mammogram for malignant neoplasm of breast: Secondary | ICD-10-CM

## 2017-12-13 ENCOUNTER — Ambulatory Visit: Payer: BC Managed Care – PPO | Admitting: Internal Medicine

## 2017-12-13 ENCOUNTER — Ambulatory Visit (INDEPENDENT_AMBULATORY_CARE_PROVIDER_SITE_OTHER)
Admission: RE | Admit: 2017-12-13 | Discharge: 2017-12-13 | Disposition: A | Discharge status: Home / Self Care | Payer: BC Managed Care – PPO | Source: Ambulatory Visit | Attending: Internal Medicine | Admitting: Internal Medicine

## 2017-12-13 DIAGNOSIS — J479 Bronchiectasis, uncomplicated: Secondary | ICD-10-CM

## 2017-12-13 DIAGNOSIS — R05 Cough: Secondary | ICD-10-CM

## 2017-12-13 DIAGNOSIS — R058 Other specified cough: Secondary | ICD-10-CM

## 2017-12-13 NOTE — Progress Notes (Signed)
Subjective:    Patient ID: Margaret Conway, female    DOB: March 19, 1955    MRN: [id number]    Brief patient profile:  62 yo [place] female from [place] never smoker with h/o seasonal rhinitis(pollen) with pollen since living in [place]  around early 2000 assoc with some intermittent coughing > ENT eval x 2 > one here/ one in [facility] "never did allergy testing" and referred to pulmonary clinic 10/04/2017 by Dr  Via for cough that has persisted daily  x 2016 assoc with abn ct chest 09/28/17 nodular lung dz assoc with bronchiectasis R > L.     Marland KitchenHistory of Present Illness  10/04/2017 1st [facility] office visit/ Benny Henrie   Chief Complaint  Patient presents with  . Pulmonary Consult    refeerred by PCP for cough, she has been coughing for 2-3 years, had a CT scan and wants to discuss results  indolent onset persistent daily cough x 2.5 years worse at hs esp since summer 2019 productive variably productive green mucus no better with zpak  Worse in am also but no assoc wheeze/ sob or cp of any kind nor fever/ ns/ wt loss  rec Bronchiectasis =   you have scarring of your bronchial tubes   Whenever you develop cough congestion take mucinex or mucinex dm 1200 mg every 12 hours> these will help keep the mucus loose and flowing but if your condition worsens you need to seek help immediately preferably here or somewhere inside the [facility] system to compare xrays ( worse = darker or bloody mucus or pain on breathing in)   levaquin 500 mg daily x 10 days     10/18/2017  f/u ov/Carlyn Lemke re: bronchiectasis  Chief Complaint  Patient presents with  . Follow-up    Doing much better  some cough at night clear mucus.  Dyspnea:  Not limited by breathing from desired activities   Cough: much lesss /min mucoid at hs and in am  Sleeping: ok one pillow  SABA use: none 02: none   rec Bronchiectasis =   you have scarring of your bronchial tubes which means that they don't function perfectly normally and mucus  tends to pool in certain areas of your lung which can cause pneumonia and further scarring of your lung and bronchial tubes Whenever you develop cough congestion take mucinex or mucinex dm 1200 mg every 12 hours and use the flutter valve as much as possible  Whenever your mucus turns nasty  > levaquin 500 mg daily x 10 days    12/13/2017  f/u ov/Jannessa Ogden re: bronchiectasis  Chief Complaint  Patient presents with  . Follow-up    cough is unchanged, prod with clear/yellow mucus worse qam. would like new flutter valve.    Dyspnea:  Not limited by breathing from desired activities   Cough:  Maybe a tbsp of dark brown which turns whiter as day goes on/ only one course of levaquin since prev ov and seemed to work fine / not using flutter or mucinex dm much  Sleeping: flat bed/ 2 piilows  SABA use: none 02: none   No obvious day to day or daytime variability or assoc excess/ purulent sputum or mucus plugs or hemoptysis or cp or chest tightness, subjective wheeze or overt sinus or hb symptoms.   Sleeping as above without nocturnal  or early am exacerbation  of respiratory  c/o's or need for noct saba. Also denies any obvious fluctuation of symptoms with weather or environmental  changes or other aggravating or alleviating factors except as outlined above   No unusual exposure hx or h/o childhood pna/ asthma or knowledge of premature birth.  Current Allergies, Complete Past Medical History, Past Surgical History, Family History, and Social History were reviewed in [employer] record.  ROS  The following are not active complaints unless bolded Hoarseness, sore throat, dysphagia, dental problems, itching, sneezing,  nasal congestion or discharge of excess mucus or purulent secretions, ear ache,   fever, chills, sweats, unintended wt loss or wt gain, classically pleuritic or exertional cp,  orthopnea pnd or arm/hand swelling  or leg swelling, presyncope, palpitations, abdominal pain,  anorexia, nausea, vomiting, diarrhea  or change in bowel habits or change in bladder habits, change in stools or change in urine, dysuria, hematuria,  rash, arthralgias, visual complaints, headache, numbness, weakness or ataxia or problems with walking or coordination,  change in mood or  memory.        Current Meds  Medication Sig  . dextromethorphan-guaiFENesin (MUCINEX DM) 30-600 MG 12hr tablet Take 2 tablets by mouth 2 (two) times daily.                    Objective:   Physical Exam  Thin somewhat  Anxious ambulatory  chinese  female nad   12/13/2017      121   10/18/2017         126   10/04/17 131 lb 6.4 oz (59.6 kg)  09/09/14 127 lb (57.6 kg)     Vital signs reviewed - Note on arrival 02 sats  97% on RA   12/13/2017 insp squeaks on R    HEENT: nl dentition, turbinates bilaterally, and oropharynx. Nl external ear canals without cough reflex   NECK :  without JVD/Nodes/TM/ nl carotid upstrokes bilaterally   LUNGS: no acc muscle use,  Nl contour chest which is clear to A and P bilaterally without cough on insp or exp maneuvers   CV:  RRR  no s3 or murmur or increase in P2, and no edema   ABD:  soft and nontender with nl inspiratory excursion in the supine position. No bruits or organomegaly appreciated, bowel sounds nl  MS:  Nl gait/ ext warm without deformities, calf tenderness, cyanosis or clubbing No obvious joint restrictions   SKIN: warm and dry without lesions    NEURO:  alert, approp, nl sensorium with  no motor or cerebellar deficits apparent.       CXR PA and Lateral:   12/13/2017 :    I personally reviewed images and agree with radiology impression as follows:   Question focus of pneumonia versus mucous plugging left mid lung region. Areas of scarring and bronchiectatic change bilaterally otherwise stable. Stable cardiac silhouette. No evident adenopathy.  Given change from recent prior study, follow-up chest radiograph in 3-4 weeks to assess  for clearing in the left mid lung region advised.               Assessment & Plan:

## 2017-12-13 NOTE — Patient Instructions (Addendum)
Whenever you develop cough congestion take mucinex or mucinex dm 1200 mg every 12 hours and use the flutter valve as much as possible   Whenever your mucus turns nasty  > levaquin 500 mg daily x 10 days   Please remember to go to the  x-ray department downstairs in the basement  for your tests - we will call you with the results when they are available.  Please schedule a follow up visit in 3 months but call sooner if needed  - add change f/u to 6 weeks with cxr and esr

## 2017-12-16 ENCOUNTER — Encounter: Payer: Self-pay | Admitting: Internal Medicine

## 2017-12-16 NOTE — Assessment & Plan Note (Signed)
Allergy profile 10/04/2017 >  Eos 0.1 /  IgE  121  RAST pos ragweed, grass, dog trees  Sinus CT 10/09/2017 >>>  Min L sphenoid chronic changes  Did ok during ragweed season just using otc antihistamines

## 2017-12-16 NOTE — Assessment & Plan Note (Addendum)
Onset of cough 2016  - Quant Ig Profile 10/04/2017 = nl  - Quant Gold TB 10/04/2017 =  Neg - ESR 10/04/2017 = 120   >> rx levaquin 500 mg daily x 10 days then repeat cxr as changes are obvious on plain cxr > improved 10/18/2017  - flutter valve instructions 10/18/2017  - Spirometry 12/13/2017  FEV1 1.8 (80%)  Ratio 78 min curvature off rx     >>Cxr shows small new areas of density on the Left and I am concerned she may not treating this condition as aggressively as need to do (with flutter, mucinex/ prn levaquin)  and may need fob with bal to r/u resistant orgs/ active MAI if not doing better over time but no immediate need for intervention so f/u q 6 weeks with cxr/ esr on return      Discussed in detail all the  indications, usual  risks and alternatives  relative to the benefits with patient who agrees to proceed with conservative f/u as outlined     I had an extended discussion with the patient reviewing all relevant studies completed to date and  lasting 15 to 20 minutes of a 25 minute visit    Each maintenance medication was reviewed in detail including most importantly the difference between maintenance and prns and under what circumstances the prns are to be triggered using an action plan format that is not reflected in the computer generated alphabetically organized AVS.     Please see AVS for specific instructions unique to this visit that I personally wrote and verbalized to the the pt in detail and then reviewed with pt  by my nurse highlighting any  changes in therapy recommended at today's visit to their plan of care.

## 2017-12-17 NOTE — Progress Notes (Signed)
Spoke with pt and notified of results per Dr. Sherene Sires. Pt verbalized understanding and denied any questions. 6 wk rov scheduled.

## 2017-12-18 ENCOUNTER — Telehealth: Payer: Self-pay | Admitting: Internal Medicine

## 2017-12-18 DIAGNOSIS — J479 Bronchiectasis, uncomplicated: Secondary | ICD-10-CM

## 2017-12-20 NOTE — Telephone Encounter (Signed)
Spoke with Margaret Conway, she wanted an earlier appt to follow up because she is not getting better. I made her an appt with Arlys John on 01/03/2018 at 10:00 am with chest xray prior to visit, ordered stat. Margaret Conway understood and nothing further is needed.

## 2018-01-03 ENCOUNTER — Ambulatory Visit: Payer: BC Managed Care – PPO | Admitting: Pulmonary Disease

## 2018-01-03 ENCOUNTER — Encounter: Payer: Self-pay | Admitting: Pulmonary Disease

## 2018-01-03 VITALS — BP 108/64 | HR 63 | Temp 98.5°F | Ht 66.0 in | Wt 123.6 lb

## 2018-01-03 DIAGNOSIS — J309 Allergic rhinitis, unspecified: Secondary | ICD-10-CM

## 2018-01-03 DIAGNOSIS — R05 Cough: Secondary | ICD-10-CM | POA: Diagnosis not present

## 2018-01-03 DIAGNOSIS — R058 Other specified cough: Secondary | ICD-10-CM

## 2018-01-03 DIAGNOSIS — J479 Bronchiectasis, uncomplicated: Secondary | ICD-10-CM

## 2018-01-03 MED ORDER — MONTELUKAST SODIUM 10 MG PO TABS: 10.0000 mg | ORAL_TABLET | Freq: Every day | ORAL | 11 refills | Status: DC

## 2018-01-03 MED ORDER — FLUTTER DEVI: 0 refills | Status: DC

## 2018-01-03 NOTE — Patient Instructions (Addendum)
please proceed to [facility] Neurosurgeon this is the generic of Zyrtec, take 1 tablet daily to help with allergy control We will start you on Singulair 10 mg daily for allergy control If allergy symptoms worsen while taking both of these medications you can use over-the-counter Flonase 1 spray each nostril daily  Local pollens: I have bolded your allergies Tree pollen-seen more in spring Grass pollen-seen more than summertime Weeds-seen more in fall  Perennial allergens: Dust mite, cockroach, mold indoor, animal dander  Mold-seen more mid summer and fall   Bronchiectasis: This is the medical term which indicates that you have damage, dilated airways making you more susceptible to respiratory infection. Use a flutter valve 10 breaths twice a day or 4 to 5 breaths 4-5 times a day to help clear mucus out Let us know if you have cough with change in mucus color or fevers or chills.  At that point you would need an antibiotic. Maintain a healthy nutritious diet, eating whole foods Take your medications as prescribed   We will give you a prescription for a new flutter valve as we do not have any more in stock in our office  Keep follow-up with Dr. Melvyn Novas in 01/29/2018 >>>Get chest x-ray completed before >>>You have lab work at that office visit for an ESR which is an inflammatory marker  It is flu season:   >>>Remember to be washing your hands regularly, using hand sanitizer, be careful to use around herself with has contact with people who are sick will increase her chances of getting sick yourself. >>> Best ways to protect herself from the flu: Receive the yearly flu vaccine, practice good hand hygiene washing with soap and also using hand sanitizer when available, eat a nutritious meals, get adequate rest, hydrate appropriately   Please contact the office if your symptoms worsen or you have concerns that you are not improving.   Thank you for choosing [facility] for  your healthcare, and for allowing [place] to partner with you on your healthcare journey. I am thankful to be able to provide care to you today.   Wyn Quaker FNP-C

## 2018-01-03 NOTE — Assessment & Plan Note (Signed)
please proceed to [facility] and purchase Allertec this is the generic of Zyrtec, take 1 tablet daily to help with allergy control We will start you on Singulair 10 mg daily for allergy control If allergy symptoms worsen while taking both of these medications you can use over-the-counter Flonase 1 spray each nostril daily  Bronchiectasis: This is the medical term which indicates that you have damage, dilated airways making you more susceptible to respiratory infection. Use a flutter valve 10 breaths twice a day or 4 to 5 breaths 4-5 times a day to help clear mucus out Let us know if you have cough with change in mucus color or fevers or chills.  At that point you would need an antibiotic. Maintain a healthy nutritious diet, eating whole foods Take your medications as prescribed   We will give you a prescription for a new flutter valve as we do not have any more in stock in our office  Keep follow-up with Dr. Melvyn Novas in 01/29/2018 >>>Get chest x-ray completed before >>>You have lab work at that office visit for an ESR which is an inflammatory marker

## 2018-01-03 NOTE — Progress Notes (Signed)
Chart and office note reviewed in detail  > agree with a/p as outlined    

## 2018-01-03 NOTE — Assessment & Plan Note (Addendum)
please proceed to [facility] and Neurosurgeon this is the generic of Zyrtec, take 1 tablet daily to help with allergy control We will start you on Singulair 10 mg daily for allergy control If allergy symptoms worsen while taking both of these medications you can use over-the-counter Flonase 1 spray each nostril daily  Local pollens: I have bolded your allergies Tree pollen-seen more in spring Grass pollen-seen more than summertime Weeds-seen more in fall  Perennial allergens: Dust mite, cockroach, mold indoor, animal dander  Mold-seen more mid summer and fall Keep follow-up with Dr. Melvyn Novas in 01/29/2018 >>>Get chest x-ray completed before >>>You have lab work at that office visit for an ESR which is an inflammatory marker

## 2018-01-03 NOTE — Assessment & Plan Note (Signed)
please proceed to [facility] and purchase Allertec this is the generic of Zyrtec, take 1 tablet daily to help with allergy control We will start you on Singulair 10 mg daily for allergy control If allergy symptoms worsen while taking both of these medications you can use over-the-counter Flonase 1 spray each nostril daily  Local pollens: I have bolded your allergies Tree pollen-seen more in spring Grass pollen-seen more than summertime Weeds-seen more in fall  Perennial allergens: Dust mite, cockroach, mold indoor, animal dander  Mold-seen more mid summer and fall Keep follow-up with Dr. Melvyn Novas in 01/29/2018 >>>Get chest x-ray completed before >>>You have lab work at that office visit for an ESR which is an inflammatory marker

## 2018-01-03 NOTE — Progress Notes (Signed)
@Patient  ID: Margaret Conway, female    DOB: February 10, 1956, 62 y.o.   MRN: [id number]  Chief Complaint  Patient presents with  . Follow-up    Cough, Bronchiectesis    Referring provider: ViaLennette Bihari, MD  HPI:  62 year old female never smoker initially referred to our office on 10/04/2017 due to abnormal CT and chronic cough.  Patient is now followed in our office for bronchiectasis (CT chest 09/28/2017 nodular disease associated with bronchiectasis right > left), allergic rhinitis.  [place] female from [place].  PMH: Allergic rhinitis Smoker/ Smoking History: Never smoker Maintenance: None Pt of: Scientist, forensic Pulmonary Encounters:   12/13/2017-office visit-[facility]ert Plan: Chest x-ray shows new small areas of density in the left none concerned patient may need bronchoscopy with BAL, follow-up in 6 weeks with chest x-ray and ESR and return   01/03/2018  - Visit   62 year old female presented to our office today for follow-up with her cough as well as bronchiectasis.  Patient has poor reports that after last being seen cough has improved slightly, sputum amount has decreased, sputum color has improved now only having a light green in the morning and progressing to clear throughout the day.  Patient with known allergies and allergy work-up but is not currently taking a daily antihistamine or Flonase.  Patient reports that cough was improving and then it got exacerbated during changes seasons.  Patient reports she had increased nasal congestion.  Patient completed her course of Levaquin.  Patient has not been using her flutter valve may be about 1 or 2 times every couple of days.  Patient is requesting a new flutter valve today.  Patient has planned follow-up in December/2019 with Dr. Melvyn Novas for chest x-ray as well as an ESR.    Tests:   - Quant Ig Profile 10/04/2017 = nl  - Quant Gold TB 10/04/2017 =  Neg - ESR 10/04/2017 = 120   >> rx levaquin 500 mg daily x 10 days then repeat cxr as  changes are obvious on plain cxr > improved 10/18/2017  - flutter valve instructions 10/18/2017  - Spirometry 12/13/2017  FEV1 1.8 (80%)  Ratio 78 min curvature off rx   Allergy profile 10/04/2017 >  Eos 0.1 /  IgE  121  RAST pos ragweed, grass, dog trees  Sinus CT 10/09/2017 >>>  Min L sphenoid chronic changes  12/13/2017-chest x-ray- question of focus of pneumonia versus mucous plugging in left midlung region, areas of scarring and bronchitic changes bilaterally, follow-up chest x-ray in 3 to 4 weeks to assess for clearing in the left midlung >>> From chart review and office note on 12/13/2017 >>>Cxr shows small new areas of density on the Left and I am concerned she may not treating this condition as aggressively as need to do (with flutter, mucinex/ prn levaquin)  and may need fob with bal to r/u resistant orgs/ active MAI if not doing better over time but no immediate need for intervention so f/u q 6 weeks with cxr/ esr on return  FENO:  No results found for: NITRICOXIDE  PFT: No flowsheet data found.  Imaging: Dg Chest 2 View  Result Date: 12/13/2017 CLINICAL DATA:  Chronic cough EXAM: CHEST - 2 VIEW COMPARISON:  October 18, 2017 and October 04, 2017 chest radiograph; chest CT September 28, 2017 FINDINGS: There is scattered scarring bilaterally, more on the right than on the left. There is new opacity in the left mid lung which may represent peripheral mucous plugging given the  repetitive the of apparent since the prior study. Early pneumonia in this area is a differential consideration. There is a degree of lower lobe bronchiectatic change which is stable. Heart size and pulmonary vascularity are normal. No adenopathy. No bone lesions. IMPRESSION: Question focus of pneumonia versus mucous plugging left mid lung region. Areas of scarring and bronchiectatic change bilaterally otherwise stable. Stable cardiac silhouette. No evident adenopathy. Given change from recent prior study, follow-up chest  radiograph in 3-4 weeks to assess for clearing in the left mid lung region advised. Electronically Signed   By: Lowella Grip III M.D.   On: 12/13/2017 16:02    Chart Review:    Specialty Problems      Pulmonary Problems   Bronchiectasis assoc with pulmonary nodules worst in RML    Onset of cough 2016  - Quant Ig Profile 10/04/2017 = nl  - Quant Gold TB 10/04/2017 =  Neg - ESR 10/04/2017 = 120   >> rx levaquin 500 mg daily x 10 days then repeat cxr as changes are obvious on plain cxr > improved 10/18/2017  - flutter valve instructions 10/18/2017  - Spirometry 12/13/2017  FEV1 1.8 (80%)  Ratio 78 min curvature off rx        Upper airway cough syndrome    Allergy profile 10/04/2017 >  Eos 0.1 /  IgE  121  RAST pos ragweed, grass, dog trees  Sinus CT 10/09/2017 >>>  Min L sphenoid chronic changes      Allergic rhinitis    Allergy profile 10/04/2017 >  Eos 0.1 /  IgE  121  RAST pos ragweed, grass, dog trees          Allergies  Allergen Reactions  . Pollen Extract     Immunization History  Administered Date(s) Administered  . Influenza, High Dose Seasonal PF 10/18/2017  . Pneumococcal Conjugate-13 10/18/2017  . Tdap 09/09/2014    Past Medical History:  Diagnosis Date  . Seasonal allergies     Tobacco History: Social History   Tobacco Use  Smoking Status Never Smoker  Smokeless Tobacco Never Used   Counseling given: Yes  Continue not smoking  Outpatient Encounter Medications as of 01/03/2018  Medication Sig  . dextromethorphan-guaiFENesin (MUCINEX DM) 30-600 MG 12hr tablet Take 2 tablets by mouth 2 (two) times daily.  Marland Kitchen Respiratory Therapy Supplies (FLUTTER) DEVI Use as directed  . montelukast (SINGULAIR) 10 MG tablet Take 1 tablet (10 mg total) by mouth at bedtime.  Marland Kitchen Respiratory Therapy Supplies (FLUTTER) DEVI Twice a day and prn as needed, may increase if feeling worse  . [DISCONTINUED] levofloxacin (LEVAQUIN) 500 MG tablet One daily x 10 days when mucus turns  nasty colored (Patient not taking: Reported on 12/13/2017)  . [DISCONTINUED] montelukast (SINGULAIR) 10 MG tablet Take 1 tablet (10 mg total) by mouth at bedtime.   No facility-administered encounter medications on file as of 01/03/2018.      Review of Systems  Review of Systems  Constitutional: Positive for fatigue. Negative for chills, fever and unexpected weight change.  HENT: Positive for congestion and postnasal drip. Negative for ear pain, sinus pressure and sinus pain.   Respiratory: Positive for cough. Negative for chest tightness, shortness of breath and wheezing.   Cardiovascular: Negative for chest pain and palpitations.  Gastrointestinal: Negative for blood in stool, diarrhea, nausea and vomiting.  Genitourinary: Negative for dysuria, frequency and urgency.  Musculoskeletal: Negative for arthralgias.  Skin: Negative for color change.  Allergic/Immunologic: Negative for environmental allergies and  food allergies.  Neurological: Negative for dizziness, light-headedness and headaches.  Psychiatric/Behavioral: Negative for dysphoric mood. The patient is not nervous/anxious.   All other systems reviewed and are negative.    Physical Exam  BP 108/64 (BP Location: Left Arm, Cuff Size: Normal)   Pulse 63   Temp 98.5 F (36.9 C) (Oral)   Ht 5' 6"  (1.676 m)   Wt 123 lb 9.6 oz (56.1 kg)   SpO2 96%   BMI 19.95 kg/m   Wt Readings from Last 5 Encounters:  01/03/18 123 lb 9.6 oz (56.1 kg)  12/13/17 121 lb (54.9 kg)  10/18/17 126 lb 9.6 oz (57.4 kg)  10/04/17 131 lb 6.4 oz (59.6 kg)  09/09/14 127 lb (57.6 kg)   Room air  Physical Exam  Constitutional: She is oriented to person, place, and time and well-developed, well-nourished, and in no distress. Vital signs are normal. She appears not dehydrated. She does not have a sickly appearance. No distress.  Thin  HENT:  Head: Normocephalic and atraumatic.  Right Ear: Hearing, external ear and ear canal normal.  Left Ear:  Hearing, external ear and ear canal normal.  Nose: Mucosal edema and rhinorrhea present. Right sinus exhibits no maxillary sinus tenderness and no frontal sinus tenderness. Left sinus exhibits no maxillary sinus tenderness and no frontal sinus tenderness.  Mouth/Throat: Uvula is midline and oropharynx is clear and moist. No oropharyngeal exudate.  + TMs with effusion without infection bilaterally, postnasal drip  Eyes: Pupils are equal, round, and reactive to light.  Neck: Normal range of motion. Neck supple. No JVD present.  Cardiovascular: Normal rate, regular rhythm and normal heart sounds.  Pulmonary/Chest: Effort normal and breath sounds normal. No accessory muscle usage. No respiratory distress. She has no decreased breath sounds. She has no wheezes. She has no rhonchi.  + Squeaks  Abdominal: Soft. Bowel sounds are normal. There is no tenderness.  Musculoskeletal: Normal range of motion. She exhibits no edema.  Lymphadenopathy:    She has no cervical adenopathy.  Neurological: She is alert and oriented to person, place, and time. Gait normal.  Skin: Skin is warm and dry. She is not diaphoretic. No erythema.  Psychiatric: Mood, memory, affect and judgment normal.  Nursing note and vitals reviewed.     Lab Results:  CBC    Component Value Date/Time   WBC 5.6 10/04/2017 1227   RBC 4.43 10/04/2017 1227   HGB 13.6 10/04/2017 1227   HCT 41.3 10/04/2017 1227   PLT 222.0 10/04/2017 1227   MCV 93.2 10/04/2017 1227   MCHC 33.0 10/04/2017 1227   RDW 12.7 10/04/2017 1227   LYMPHSABS 2.1 10/04/2017 1227   MONOABS 0.6 10/04/2017 1227   EOSABS 0.1 10/04/2017 1227   BASOSABS 0.0 10/04/2017 1227    BMET No results found for: NA, K, CL, CO2, GLUCOSE, BUN, CREATININE, CALCIUM, GFRNONAA, GFRAA  BNP No results found for: BNP  ProBNP No results found for: PROBNP    Assessment & Plan:   Pleasant 62 year old female patient seen in our office today for follow-up of her cough.  I do  not believe the patient needs a bronchoscopy at this time as patient's symptoms are actually improving.  Cough is lessened, sputum production has lessened, sputum color has improved.  I do believe patient needs better management of her allergic rhinitis and known allergies.  We will have patient start daily antihistamine as well as Singulair.  We will also patient become more compliant with pulmonary toilet with new flutter  valve today.  Patient to use flutter valve daily twice daily or more as a maintenance.  Patient agrees.  Patient to keep follow-up with Dr. Melvyn Novas in December/2019 with chest x-ray and ESR.  Upper airway cough syndrome please proceed to [employer] [facility] this is the generic of Zyrtec, take 1 tablet daily to help with allergy control We will start you on Singulair 10 mg daily for allergy control If allergy symptoms worsen while taking both of these medications you can use over-the-counter Flonase 1 spray each nostril daily  Local pollens: I have bolded your allergies Tree pollen-seen more in spring Grass pollen-seen more than summertime Weeds-seen more in fall  Perennial allergens: Dust mite, cockroach, mold indoor, animal dander  Mold-seen more mid summer and fall Keep follow-up with Dr. Melvyn Novas in 01/29/2018 >>>Get chest x-ray completed before >>>You have lab work at that office visit for an ESR which is an inflammatory marker  Bronchiectasis assoc with pulmonary nodules worst in RML please proceed to [employer] and purchase Allertec this is the generic of Zyrtec, take 1 tablet daily to help with allergy control We will start you on Singulair 10 mg daily for allergy control If allergy symptoms worsen while taking both of these medications you can use over-the-counter Flonase 1 spray each nostril daily  Bronchiectasis: This is the medical term which indicates that you have damage, dilated airways making you more susceptible to respiratory infection. Use a flutter  valve 10 breaths twice a day or 4 to 5 breaths 4-5 times a day to help clear mucus out Let us know if you have cough with change in mucus color or fevers or chills.  At that point you would need an antibiotic. Maintain a healthy nutritious diet, eating whole foods Take your medications as prescribed   We will give you a prescription for a new flutter valve as we do not have any more in stock in our office  Keep follow-up with Dr. Melvyn Novas in 01/29/2018 >>>Get chest x-ray completed before >>>You have lab work at that office visit for an ESR which is an inflammatory marker  Allergic rhinitis please proceed to [employer] and purchase Allertec this is the generic of Zyrtec, take 1 tablet daily to help with allergy control We will start you on Singulair 10 mg daily for allergy control If allergy symptoms worsen while taking both of these medications you can use over-the-counter Flonase 1 spray each nostril daily  Local pollens: I have bolded your allergies Tree pollen-seen more in spring Grass pollen-seen more than summertime Weeds-seen more in fall  Perennial allergens: Dust mite, cockroach, mold indoor, animal dander  Mold-seen more mid summer and fall Keep follow-up with Dr. Melvyn Novas in 01/29/2018 >>>Get chest x-ray completed before >>>You have lab work at that office visit for an ESR which is an inflammatory marker     Lauraine Rinne, NP 01/03/2018

## 2018-01-29 ENCOUNTER — Ambulatory Visit: Payer: BC Managed Care – PPO | Admitting: Internal Medicine

## 2018-03-14 ENCOUNTER — Ambulatory Visit: Payer: BC Managed Care – PPO | Admitting: Internal Medicine

## 2018-10-22 ENCOUNTER — Telehealth: Payer: Self-pay | Admitting: Internal Medicine

## 2018-10-22 NOTE — Telephone Encounter (Signed)
Left message for patient to call back  

## 2018-10-22 NOTE — Telephone Encounter (Signed)
Pt calling inquiring if she is due for her pneumonia vaccine. According to pt's records, her most recent pneumonia vaccine is pneumococcal Conjugate-13 administered on 10/18/2017.   MW, please advise. Thank you.

## 2018-10-22 NOTE — Telephone Encounter (Signed)
Patient is returning phone call.  Patient phone number is [phone].

## 2018-10-22 NOTE — Telephone Encounter (Signed)
Yes will need pneumovax now and in 5 years repeated

## 2018-10-22 NOTE — Telephone Encounter (Signed)
Called and spoke with pt regarding MW's response below. Pt verbalized understanding and agreed to setting up an injection appt to receive the pneumovax 23 injection tomorrow 10/23/2018 at 4:30 PM EDT. Appt has been scheduled. Nothing further needed at this time.

## 2018-10-23 ENCOUNTER — Other Ambulatory Visit: Payer: Self-pay | Admitting: Family Medicine

## 2018-10-23 ENCOUNTER — Ambulatory Visit (INDEPENDENT_AMBULATORY_CARE_PROVIDER_SITE_OTHER): Payer: BC Managed Care – PPO

## 2018-10-23 ENCOUNTER — Other Ambulatory Visit: Payer: Self-pay

## 2018-10-23 DIAGNOSIS — Z23 Encounter for immunization: Secondary | ICD-10-CM

## 2018-10-23 DIAGNOSIS — Z1231 Encounter for screening mammogram for malignant neoplasm of breast: Secondary | ICD-10-CM

## 2018-10-24 ENCOUNTER — Ambulatory Visit (INDEPENDENT_AMBULATORY_CARE_PROVIDER_SITE_OTHER): Payer: BC Managed Care – PPO | Admitting: Obstetrics and Gynecology

## 2018-10-24 ENCOUNTER — Encounter: Payer: Self-pay | Admitting: Obstetrics and Gynecology

## 2018-10-24 VITALS — BP 120/72 | HR 76 | Temp 97.1°F | Ht 66.0 in | Wt 127.0 lb

## 2018-10-24 DIAGNOSIS — Z0000 Encounter for general adult medical examination without abnormal findings: Secondary | ICD-10-CM

## 2018-10-24 DIAGNOSIS — Z01419 Encounter for gynecological examination (general) (routine) without abnormal findings: Secondary | ICD-10-CM | POA: Diagnosis not present

## 2018-10-24 DIAGNOSIS — Z8739 Personal history of other diseases of the musculoskeletal system and connective tissue: Secondary | ICD-10-CM | POA: Diagnosis not present

## 2018-10-24 DIAGNOSIS — E559 Vitamin D deficiency, unspecified: Secondary | ICD-10-CM | POA: Diagnosis not present

## 2018-10-24 NOTE — Progress Notes (Signed)
63 y.o. [id number] Legally Seperated Asian Not Hispanic or Latino female here for annual exam.   No vaginal bleeding. Not sexually active, separated x 2 years.   She has occasional, mild GSI, doing Kegel's which are helping.     No LMP recorded. Patient is postmenopausal.          Sexually active: No.  The current method of family planning is post menopausal status.    Exercising: Yes.    walking, biking Smoker:  no  Health Maintenance: Pap:  2016 WNL History of abnormal Pap:  no MMG:  11/15/2017 Birads 1 negative BMD:   10/27/2016 Osteopenia, T score -1.3, FRAX 3.7/0.3% Colonoscopy: 06/08/2009 polyp, due next year TDaP:  09/09/2014 Gardasil: N/A   reports that she has never smoked. She has never used smokeless tobacco. She reports that she does not drink alcohol or use drugs. She drives a school bus. 3 kids, one in [place] (in the [place]), one in [place], one is in [place]. 7 grandchildren.   Past Medical History:  Diagnosis Date  . Osteopenia    mild  . Seasonal allergies   . Vitamin D deficiency     History reviewed. No pertinent surgical history.  Current Outpatient Medications  Medication Sig Dispense Refill  . dextromethorphan-guaiFENesin (MUCINEX DM) 30-600 MG 12hr tablet Take 2 tablets by mouth 2 (two) times daily as needed.     Marland Kitchen levofloxacin (LEVAQUIN) 500 MG tablet Take 1 tablet by mouth as needed.    . montelukast (SINGULAIR) 10 MG tablet Take 1 tablet (10 mg total) by mouth at bedtime. 30 tablet 11  . Respiratory Therapy Supplies (FLUTTER) DEVI Use as directed 1 each 0  . Respiratory Therapy Supplies (FLUTTER) DEVI Twice a day and prn as needed, may increase if feeling worse 1 each 0   No current facility-administered medications for this visit.     Family History  Problem Relation Age of Onset  . Alzheimer's disease Mother   . Alzheimer's disease Father   . Kidney disease Brother   . Breast cancer Neg Hx     Review of Systems  Constitutional: Negative.   HENT:  Negative.   Eyes: Negative.   Respiratory: Negative.   Cardiovascular: Negative.   Gastrointestinal: Negative.   Endocrine: Negative.   Genitourinary: Negative.   Musculoskeletal: Negative.   Skin: Negative.   Allergic/Immunologic: Negative.   Neurological: Negative.   Hematological: Negative.   Psychiatric/Behavioral: Negative.     Exam:   BP 120/72 (BP Location: Right Arm, Patient Position: Sitting, Cuff Size: Normal)   Pulse 76   Temp (!) 97.1 F (36.2 C) (Skin)   Ht 5\' 6"  (1.676 m)   Wt 127 lb (57.6 kg)   BMI 20.50 kg/m   Weight change: @WEIGHTCHANGE @ Height:   Height: 5\' 6"  (167.6 cm)  Ht Readings from Last 3 Encounters:  10/24/18 5\' 6"  (1.676 m)  01/03/18 5\' 6"  (1.676 m)  12/13/17 5\' 6"  (1.676 m)    General appearance: alert, cooperative and appears stated age Head: Normocephalic, without obvious abnormality, atraumatic Neck: no adenopathy, supple, symmetrical, trachea midline and thyroid normal to inspection and palpation Lungs: clear to auscultation bilaterally Cardiovascular: regular rate and rhythm Breasts: normal appearance, no masses or tenderness Abdomen: soft, non-tender; non distended,  no masses,  no organomegaly Extremities: extremities normal, atraumatic, no cyanosis or edema Skin: Skin color, texture, turgor normal. No rashes or lesions Lymph nodes: Cervical, supraclavicular, and axillary nodes normal. No abnormal inguinal nodes palpated Neurologic: Grossly normal  Pelvic: External genitalia:  no lesions              Urethra:  normal appearing urethra with no masses, tenderness or lesions              Bartholins and Skenes: normal                 Vagina: atrophic appearing vagina with normal color and discharge, no lesions              Cervix: no lesions               Bimanual Exam:  Uterus:  normal size, contour, position, consistency, mobility, non-tender              Adnexa: no mass, fullness, tenderness               Rectovaginal: Confirms                Anus:  normal sphincter tone, no lesions  Chaperone was present for exam.  A:  Well Woman with normal exam  H/o mild osteopenia  Vaginal atrophy, not sexually active, not symptomatic  P:   Next DEXA in 2023  Discussed breast self exam  Discussed calcium and vit D intake  Mammogram next month  Colonoscopy next year  Return for fasting labs

## 2018-10-24 NOTE — Patient Instructions (Signed)

## 2018-10-28 ENCOUNTER — Other Ambulatory Visit (INDEPENDENT_AMBULATORY_CARE_PROVIDER_SITE_OTHER): Payer: BC Managed Care – PPO

## 2018-10-28 ENCOUNTER — Other Ambulatory Visit: Payer: Self-pay

## 2018-10-28 DIAGNOSIS — Z0000 Encounter for general adult medical examination without abnormal findings: Secondary | ICD-10-CM

## 2018-10-28 DIAGNOSIS — E559 Vitamin D deficiency, unspecified: Secondary | ICD-10-CM

## 2018-10-29 LAB — COMPREHENSIVE METABOLIC PANEL
ALT: 18 IU/L (ref 0–32)
AST: 22 IU/L (ref 0–40)
Albumin/Globulin Ratio: 1 — ABNORMAL LOW (ref 1.2–2.2)
Albumin: 3.8 g/dL (ref 3.8–4.8)
Alkaline Phosphatase: 82 IU/L (ref 39–117)
BUN/Creatinine Ratio: 26 (ref 12–28)
BUN: 15 mg/dL (ref 8–27)
Bilirubin Total: <0.2 mg/dL (ref 0.0–1.2)
CO2: 23 mmol/L (ref 20–29)
Calcium: 8.9 mg/dL (ref 8.7–10.3)
Chloride: 105 mmol/L (ref 96–106)
Creatinine, Ser: 0.57 mg/dL (ref 0.57–1.00)
GFR calc Af Amer: 115 mL/min/{1.73_m2} (ref >59)
GFR calc non Af Amer: 100 mL/min/{1.73_m2} (ref >59)
Globulin, Total: 3.7 g/dL (ref 1.5–4.5)
Glucose: 93 mg/dL (ref 65–99)
Potassium: 4.6 mmol/L (ref 3.5–5.2)
Sodium: 139 mmol/L (ref 134–144)
Total Protein: 7.5 g/dL (ref 6.0–8.5)

## 2018-10-29 LAB — VITAMIN D 25 HYDROXY (VIT D DEFICIENCY, FRACTURES): Vit D, 25-Hydroxy: 41.1 ng/mL (ref 30.0–100.0)

## 2018-10-29 LAB — LIPID PANEL
Chol/HDL Ratio: 3.8 ratio (ref 0.0–4.4)
Cholesterol, Total: 199 mg/dL (ref 100–199)
HDL: 52 mg/dL (ref >39)
LDL Chol Calc (NIH): 132 mg/dL — ABNORMAL HIGH (ref 0–99)
Triglycerides: 80 mg/dL (ref 0–149)
VLDL Cholesterol Cal: 15 mg/dL (ref 5–40)

## 2018-10-29 LAB — CBC
Hematocrit: 39.5 % (ref 34.0–46.6)
Hemoglobin: 13.3 g/dL (ref 11.1–15.9)
MCH: 31.4 pg (ref 26.6–33.0)
MCHC: 33.7 g/dL (ref 31.5–35.7)
MCV: 93 fL (ref 79–97)
Platelets: 244 10*3/uL (ref 150–450)
RBC: 4.23 x10E6/uL (ref 3.77–5.28)
RDW: 12.4 % (ref 11.7–15.4)
WBC: 5 10*3/uL (ref 3.4–10.8)

## 2018-11-07 ENCOUNTER — Telehealth: Payer: Self-pay | Admitting: Internal Medicine

## 2018-11-07 MED ORDER — LEVOFLOXACIN 500 MG PO TABS: 500.0000 mg | ORAL_TABLET | Freq: Every day | ORAL | 0 refills | Status: DC

## 2018-11-07 NOTE — Telephone Encounter (Signed)
Called and spoke to pt. Pt is requesting Levaquin as she is c/o prod cough with green mucus. Pt states Mw has given Levaquin with refills in the past but has ran out of these. Pt was last seen in 12/2017 by Brian Mack, NP. I offered pt an appt but she declined stating her work schedule wouldn't allow it at this time but will call and schedule an appt when work will allow. Pt denies any SOB, CP/tightness, f/c/s. Pt states she is just having the prod cough with green mucus.   Dr. Wert please advise. Thanks.     Instructions from 12/13/2017 with MW:  Whenever you develop cough congestion take mucinex or mucinex dm 1200 mg every 12 hours and use the flutter valve as much as possible    Whenever your mucus turns nasty  > levaquin 500 mg daily x 10 days    Please remember to go to the  x-ray department downstairs in the basement  for your tests - we will call you with the results when they are available.   Please schedule a follow up visit in 3 months but call sooner if needed  - add change f/u to 6 weeks with cxr and esr       

## 2018-11-07 NOTE — Telephone Encounter (Signed)
LM for patient to call back and let us know where she would like rx sent to.

## 2018-11-07 NOTE — Telephone Encounter (Signed)
LMTCB x 1 

## 2018-11-07 NOTE — Telephone Encounter (Signed)
Levaquin 500 mg daily  X 10 days  - ok to refill x one but need ov before more - hopefully get this done byt he end of the year.

## 2018-11-07 NOTE — Telephone Encounter (Signed)
Pt returned call let her know that rx request will be sent but she would need to sched an ov before anymore is sent she understood and said that she would call back to sched appoint, her sched is always changing due to covid and school sched.Margaret Conway

## 2018-11-07 NOTE — Telephone Encounter (Signed)
Pt confirmed [facility] [address] rx sent Nothing further needed.

## 2018-12-02 ENCOUNTER — Ambulatory Visit
Admission: RE | Admit: 2018-12-02 | Discharge: 2018-12-02 | Disposition: A | Discharge status: Home / Self Care | Payer: BC Managed Care – PPO | Source: Ambulatory Visit | Attending: Family Medicine | Admitting: Family Medicine

## 2018-12-02 ENCOUNTER — Other Ambulatory Visit: Payer: Self-pay

## 2018-12-02 DIAGNOSIS — Z1231 Encounter for screening mammogram for malignant neoplasm of breast: Secondary | ICD-10-CM

## 2018-12-05 ENCOUNTER — Ambulatory Visit: Payer: BC Managed Care – PPO

## 2019-04-12 ENCOUNTER — Ambulatory Visit: Payer: BC Managed Care – PPO | Attending: Internal Medicine

## 2019-04-12 DIAGNOSIS — Z23 Encounter for immunization: Secondary | ICD-10-CM | POA: Insufficient documentation

## 2019-04-12 NOTE — Progress Notes (Signed)
   Covid-19 Vaccination Clinic  Name:  Margaret Conway    MRN: [id number] DOB: November 26, 1955  04/12/2019  [name] was observed post Covid-19 immunization for 15 minutes without incidence. She was provided with Vaccine Information Sheet and instruction to access the V-Safe system.   [name] was instructed to call 911 with any severe reactions post vaccine: Marland Kitchen Difficulty breathing  . Swelling of your face and throat  . A fast heartbeat  . A bad rash all over your body  . Dizziness and weakness    Immunizations Administered    Name Date Dose VIS Date Route   Pfizer COVID-19 Vaccine 04/12/2019  4:01 PM 0.3 mL 01/24/2019 Intramuscular   Manufacturer: [employer], [employer]   Lot: [id number]   NDC: [address]-8

## 2019-05-03 ENCOUNTER — Ambulatory Visit: Payer: BC Managed Care – PPO | Attending: Internal Medicine

## 2019-05-03 DIAGNOSIS — Z23 Encounter for immunization: Secondary | ICD-10-CM

## 2019-05-03 NOTE — Progress Notes (Signed)
   Covid-19 Vaccination Clinic  Name:  Lucely Leard    MRN: [id number] DOB: Feb 04, 1956  05/03/2019  Ms. Gionfriddo was observed post Covid-19 immunization for 15 minutes without incident. She was provided with Vaccine Information Sheet and instruction to access the V-Safe system.   Ms. Dimercurio was instructed to call 911 with any severe reactions post vaccine: Marland Kitchen Difficulty breathing  . Swelling of face and throat  . A fast heartbeat  . A bad rash all over body  . Dizziness and weakness   Immunizations Administered    Name Date Dose VIS Date Route   Pfizer COVID-19 Vaccine 05/03/2019  8:40 AM 0.3 mL 01/24/2019 Intramuscular   Manufacturer: [employer], [employer]   Lot: [id number]   NDC: [address]-7

## 2019-05-21 ENCOUNTER — Encounter: Payer: Self-pay | Admitting: Gastroenterology

## 2019-06-26 ENCOUNTER — Encounter: Payer: Self-pay | Admitting: Gastroenterology

## 2019-07-02 ENCOUNTER — Encounter: Payer: BC Managed Care – PPO | Admitting: Gastroenterology

## 2019-07-03 ENCOUNTER — Other Ambulatory Visit: Payer: Self-pay

## 2019-07-03 ENCOUNTER — Ambulatory Visit (AMBULATORY_SURGERY_CENTER): Payer: Self-pay | Admitting: *Deleted

## 2019-07-03 VITALS — Ht 66.0 in | Wt 132.0 lb

## 2019-07-03 DIAGNOSIS — Z1211 Encounter for screening for malignant neoplasm of colon: Secondary | ICD-10-CM

## 2019-07-03 MED ORDER — SUTAB 1479-225-188 MG PO TABS: 24.0000 | ORAL_TABLET | ORAL | 0 refills | Status: DC

## 2019-07-03 NOTE — Progress Notes (Signed)
05-03-2019 comp. covid vaccines  sutab code to pharmacy, coupon to pt   No egg or soy allergy known to patient  No issues with past sedation with any surgeries  or procedures, no intubation problems  No diet pills per patient No home 02 use per patient  No blood thinners per patient  Pt denies issues with constipation  No A fib or A flutter  EMMI video sent to pt's e mail   Due to the COVID-19 pandemic we are asking patients to follow these guidelines. Please only bring one care partner. Please be aware that your care partner may wait in the car in the parking lot or if they feel like they will be too hot to wait in the car, they may wait in the lobby on the 4th floor. All care partners are required to wear a mask the entire time (we do not have any that we can provide them), they need to practice social distancing, and we will do a Covid check for all patient's and care partners when you arrive. Also we will check their temperature and your temperature. If the care partner waits in their car they need to stay in the parking lot the entire time and we will call them on their cell phone when the patient is ready for discharge so they can bring the car to the front of the building. Also all patient's will need to wear a mask into building.

## 2019-07-04 ENCOUNTER — Encounter: Payer: Self-pay | Admitting: Gastroenterology

## 2019-07-18 ENCOUNTER — Ambulatory Visit (AMBULATORY_SURGERY_CENTER): Payer: BC Managed Care – PPO | Admitting: Gastroenterology

## 2019-07-18 ENCOUNTER — Encounter: Payer: Self-pay | Admitting: Gastroenterology

## 2019-07-18 ENCOUNTER — Other Ambulatory Visit: Payer: Self-pay

## 2019-07-18 VITALS — BP 98/56 | HR 61 | Temp 96.5°F | Resp 14 | Ht 66.0 in | Wt 132.0 lb

## 2019-07-18 DIAGNOSIS — Z1211 Encounter for screening for malignant neoplasm of colon: Secondary | ICD-10-CM

## 2019-07-18 MED ORDER — SODIUM CHLORIDE 0.9 % IV SOLN: 500.0000 mL | Freq: Once | INTRAVENOUS | Status: AC

## 2019-07-18 NOTE — Patient Instructions (Signed)
YOU HAD AN ENDOSCOPIC PROCEDURE TODAY AT THE [facility]:   Refer to the procedure report that was given to you for any specific questions about what was found during the examination.  If the procedure report does not answer your questions, please call your gastroenterologist to clarify.  If you requested that your care partner not be given the details of your procedure findings, then the procedure report has been included in a sealed envelope for you to review at your convenience later.  YOU SHOULD EXPECT: Some feelings of bloating in the abdomen. Passage of more gas than usual.  Walking can help get rid of the air that was put into your GI tract during the procedure and reduce the bloating. If you had a lower endoscopy (such as a colonoscopy or flexible sigmoidoscopy) you may notice spotting of blood in your stool or on the toilet paper. If you underwent a bowel prep for your procedure, you may not have a normal bowel movement for a few days.  Please Note:  You might notice some irritation and congestion in your nose or some drainage.  This is from the oxygen used during your procedure.  There is no need for concern and it should clear up in a day or so.  SYMPTOMS TO REPORT IMMEDIATELY:   Following lower endoscopy (colonoscopy or flexible sigmoidoscopy):  Excessive amounts of blood in the stool  Significant tenderness or worsening of abdominal pains  Swelling of the abdomen that is new, acute  Fever of 100F or higher   For urgent or emergent issues, a gastroenterologist can be reached at any hour by calling [phone]. Do not use MyChart messaging for urgent concerns.    DIET:  We do recommend a small meal at first, but then you may proceed to your regular diet.  Drink plenty of fluids but you should avoid alcoholic beverages for 24 hours.  MEDICATIONS: Continue present medications.  Please see handouts given to you by your recovery nurse.  ACTIVITY:  You should plan to  take it easy for the rest of today and you should NOT DRIVE or use heavy machinery until tomorrow (because of the sedation medicines used during the test).    FOLLOW UP: Our staff will call the number listed on your records 48-72 hours following your procedure to check on you and address any questions or concerns that you may have regarding the information given to you following your procedure. If we do not reach you, we will leave a message.  We will attempt to reach you two times.  During this call, we will ask if you have developed any symptoms of COVID 19. If you develop any symptoms (ie: fever, flu-like symptoms, shortness of breath, cough etc.) before then, please call [phone].  If you test positive for Covid 19 in the 2 weeks post procedure, please call and report this information to us.    If any biopsies were taken you will be contacted by phone or by letter within the next 1-3 weeks.  Please call us at [phone] if you have not heard about the biopsies in 3 weeks.   Thank you for allowing us to provide for your healthcare needs today.   SIGNATURES/CONFIDENTIALITY: You and/or your care partner have signed paperwork which will be entered into your electronic medical record.  These signatures attest to the fact that that the information above on your After Visit Summary has been reviewed and is understood.  Full responsibility of the   confidentiality of this discharge information lies with you and/or your care-partner. 

## 2019-07-18 NOTE — Op Note (Signed)
[facility] Patient Name: Margaret Conway Procedure Date: 07/18/2019 11:06 AM MRN: [id number] Endoscopist: Viviann Spare P. Adela Lank , MD Age: 64 Referring MD:  Date of Birth: [birth year] Gender: Female Account #: [id number] Procedure:                Colonoscopy Indications:              Screening for colorectal malignant neoplasm Medicines:                Monitored Anesthesia Care Procedure:                Pre-Anesthesia Assessment:                           - Prior to the procedure, a History and Physical                            was performed, and patient medications and                            allergies were reviewed. The patient's tolerance of                            previous anesthesia was also reviewed. The risks                            and benefits of the procedure and the sedation                            options and risks were discussed with the patient.                            All questions were answered, and informed consent                            was obtained. Prior Anticoagulants: The patient has                            taken no previous anticoagulant or antiplatelet                            agents. ASA Grade Assessment: I - A normal, healthy                            patient. After reviewing the risks and benefits,                            the patient was deemed in satisfactory condition to                            undergo the procedure.                           After obtaining informed consent, the colonoscope  was passed under direct vision. Throughout the                            procedure, the patient's blood pressure, pulse, and                            oxygen saturations were monitored continuously. The                            Colonoscope was introduced through the anus and                            advanced to the the cecum, identified by                            appendiceal orifice and ileocecal  valve. The                            colonoscopy was performed without difficulty. The                            patient tolerated the procedure well. The quality                            of the bowel preparation was good. The ileocecal                            valve, appendiceal orifice, and rectum were                            photographed. Scope In: 11:12:46 AM Scope Out: 11:30:12 AM Scope Withdrawal Time: 0 hours 13 minutes 41 seconds  Total Procedure Duration: 0 hours 17 minutes 26 seconds  Findings:                 The perianal and digital rectal examinations were                            normal.                           Multiple small-mouthed diverticula were found in                            the right colon.                           The exam was otherwise without abnormality. No                            polyps. Complications:            No immediate complications. Estimated blood loss:                            None. Estimated Blood Loss:     Estimated blood loss: none. Impression:               -  Diverticulosis in the right colon.                           - The examination was otherwise normal.                           - No polyps. Recommendation:           - Patient has a contact number available for                            emergencies. The signs and symptoms of potential                            delayed complications were discussed with the                            patient. Return to normal activities tomorrow.                            Written discharge instructions were provided to the                            patient.                           - Resume previous diet.                           - Continue present medications.                           - Repeat colonoscopy in 10 years for screening                            purposes. Viviann Spare P. Devetta Hagenow, MD 07/18/2019 11:35:39 AM This report has been signed electronically.

## 2019-07-18 NOTE — Progress Notes (Signed)
Pt's states no medical or surgical changes since previsit or office visit.  Temp- June Vitals- Donna 

## 2019-07-18 NOTE — Progress Notes (Signed)
Report given to PACU, vss 

## 2019-07-22 ENCOUNTER — Telehealth: Payer: Self-pay

## 2019-07-22 NOTE — Telephone Encounter (Signed)
  Follow up Call-  Call back number 07/18/2019  Post procedure Call Back phone  # [phone]  Permission to leave phone message Yes  Some recent data might be hidden     Patient questions:  Do you have a fever, pain , or abdominal swelling? No. Pain Score  0 *  Have you tolerated food without any problems? Yes.    Have you been able to return to your normal activities? Yes.    Do you have any questions about your discharge instructions: Diet   No. Medications  No. Follow up visit  No.  Do you have questions or concerns about your Care? No.  Actions: * If pain score is 4 or above: No action needed, pain <4.  1. Have you developed a fever since your procedure? no  2.   Have you had an respiratory symptoms (SOB or cough) since your procedure? no  3.   Have you tested positive for COVID 19 since your procedure no  4.   Have you had any family members/close contacts diagnosed with the COVID 19 since your procedure?  no   If yes to any of these questions please route to Laverna Peace, RN and Charlett Lango, RN

## 2019-07-23 ENCOUNTER — Telehealth: Payer: Self-pay

## 2019-07-23 NOTE — Telephone Encounter (Signed)
Patient is calling for medication refill.

## 2019-07-24 NOTE — Telephone Encounter (Signed)
Next AEX 10/29/19 Last MMG 11/2018- Birads 1, Neg PMP Last used Estrace in 2016 H/o vaginal atrophy H/o mild osteopenia   Spoke with pt. Pt requesting refill on estrace vaginal cream. Last used in 2016. Has recently reconciled with husband and is wanting to become SA again. Pt states in recent past , had painful intercourse due to vaginal dryness.  Advised will review with Dr Oscar La for Estrace Rx refill request and return call to pt. Pt agreeable.   Routing to Dr Oscar La. Pharmacy verified.

## 2019-07-25 MED ORDER — ESTRADIOL 0.1 MG/GM VA CREA: TOPICAL_CREAM | VAGINAL | 0 refills | Status: DC

## 2019-07-25 NOTE — Telephone Encounter (Signed)
Yes, estrace cream refilled. mychart message sent

## 2019-10-24 ENCOUNTER — Other Ambulatory Visit: Payer: Self-pay | Admitting: Family Medicine

## 2019-10-24 DIAGNOSIS — Z1231 Encounter for screening mammogram for malignant neoplasm of breast: Secondary | ICD-10-CM

## 2019-10-28 NOTE — Progress Notes (Signed)
64 y.o. [id number] Married Asian Not Hispanic or Latino female here for annual exam. Patient requested urinalysis dip due to history of infection.  She is back with her husband, using estrogen cream 1 x a week. With the cream no dyspareunia.  No vaginal bleeding.  H/O mild GSI, tolerable, a little better with doing Kegels.     No LMP recorded. Patient is postmenopausal.          Sexually active: Yes.    The current method of family planning is post menopausal status.    Exercising: Yes.    Power walking  Smoker:  no  Health Maintenance: Pap:  2016 WNL  History of abnormal Pap:  no MMG:  12/02/18 density D Bi-rads 1 neg  BMD:    10/27/2016 Osteopenia, T score -1.3, FRAX 3.7/0.3% Colonoscopy: 07/18/19 f/u 10 years  TDaP:  09/09/14  Gardasil: n/a   reports that she has never smoked. She has never used smokeless tobacco. She reports that she does not drink alcohol and does not use drugs. She drives a school bus. 3 kids, one in [place] (in [place]), one in [place], one is in [place]. 7 grandchildren.   Past Medical History:  Diagnosis Date  . Allergy   . Cough   . Osteopenia    mild  . Seasonal allergies   . Vitamin D deficiency     Past Surgical History:  Procedure Laterality Date  . COLONOSCOPY  2011   HPP X 1  . POLYPECTOMY     HPP x 1 2011    Current Outpatient Medications  Medication Sig Dispense Refill  . Ascorbic Acid (VITAMIN C) 1000 MG tablet Take 1,000 mg by mouth daily.    . Calcium Carbonate-Vitamin D (CALCIUM 600+D PO) Take by mouth.    . clobetasol ointment (TEMOVATE) 0.05 %     . estradiol (ESTRACE) 0.1 MG/GM vaginal cream 1 gram vaginally twice weekly 42.5 g 0  . Multiple Vitamin (MULTIVITAMIN) tablet Take 1 tablet by mouth daily.     Current Facility-Administered Medications  Medication Dose Route Frequency Provider Last Rate Last Admin  . 0.9 %  sodium chloride infusion  500 mL Intravenous Once Armbruster, Willaim Rayas, MD      . 0.9 %  sodium chloride infusion  500  mL Intravenous Once Armbruster, Willaim Rayas, MD        Family History  Problem Relation Age of Onset  . Alzheimer's disease Mother   . Alzheimer's disease Father   . Kidney disease Brother   . Colon polyps Brother   . Breast cancer Neg Hx   . Colon cancer Neg Hx   . Esophageal cancer Neg Hx   . Rectal cancer Neg Hx   . Stomach cancer Neg Hx     Review of Systems  All other systems reviewed and are negative.   Exam:   BP 102/62   Pulse 80   Ht 5\' 6"  (1.676 m)   Wt 128 lb 6.4 oz (58.2 kg)   SpO2 99%   BMI 20.72 kg/m   Weight change: @WEIGHTCHANGE @ Height:   Height: 5\' 6"  (167.6 cm)  Ht Readings from Last 3 Encounters:  10/29/19 5\' 6"  (1.676 m)  07/18/19 5\' 6"  (1.676 m)  07/03/19 5\' 6"  (1.676 m)    General appearance: alert, cooperative and appears stated age Head: Normocephalic, without obvious abnormality, atraumatic Neck: no adenopathy, supple, symmetrical, trachea midline and thyroid normal to inspection and palpation Lungs: clear to auscultation bilaterally Cardiovascular:  regular rate and rhythm Breasts: normal appearance, no masses or tenderness Abdomen: soft, non-tender; non distended,  no masses,  no organomegaly Extremities: extremities normal, atraumatic, no cyanosis or edema Skin: Skin color, texture, turgor normal. No rashes or lesions Lymph nodes: Cervical, supraclavicular, and axillary nodes normal. No abnormal inguinal nodes palpated Neurologic: Grossly normal   Pelvic: External genitalia:  no lesions              Urethra:  normal appearing urethra with no masses, tenderness or lesions              Bartholins and Skenes: normal                 Vagina: normal appearing vagina with normal color and discharge, no lesions              Cervix: flush with vagina, friable.                Bimanual Exam:  Uterus:  normal size, contour, position, consistency, mobility, non-tender              Adnexa: no mass, fullness, tenderness               Rectovaginal:  Confirms               Anus:  normal sphincter tone, no lesions  Carolynn Serve chaperoned for the exam.  A:  Well Woman with normal exam  H/O mild osteopenia  Vaginal atrophy   P:   Next DEXA in 2023  Pap with hpv  Mammogram next month  Colonoscopy UTD  Screening labs  Discussed breast self exam  Discussed calcium and vit D intake  Addendum: the patient has a h/o UTI and requested urine dip. No current infections. I reviewed with her that routine screening is not recommended. Discussed asymptomatic bacturia and how it is not recommended to treat that.

## 2019-10-29 ENCOUNTER — Ambulatory Visit (INDEPENDENT_AMBULATORY_CARE_PROVIDER_SITE_OTHER): Payer: BC Managed Care – PPO | Admitting: Obstetrics and Gynecology

## 2019-10-29 ENCOUNTER — Other Ambulatory Visit: Payer: Self-pay

## 2019-10-29 ENCOUNTER — Encounter: Payer: Self-pay | Admitting: Obstetrics and Gynecology

## 2019-10-29 ENCOUNTER — Telehealth: Payer: Self-pay

## 2019-10-29 ENCOUNTER — Other Ambulatory Visit (HOSPITAL_COMMUNITY)
Admission: RE | Admit: 2019-10-29 | Discharge: 2019-10-29 | Disposition: A | Discharge status: Home / Self Care | Payer: BC Managed Care – PPO | Source: Ambulatory Visit | Attending: Obstetrics and Gynecology | Admitting: Obstetrics and Gynecology

## 2019-10-29 VITALS — BP 102/62 | HR 80 | Ht 66.0 in | Wt 128.4 lb

## 2019-10-29 DIAGNOSIS — Z124 Encounter for screening for malignant neoplasm of cervix: Secondary | ICD-10-CM | POA: Insufficient documentation

## 2019-10-29 DIAGNOSIS — Z87440 Personal history of urinary (tract) infections: Secondary | ICD-10-CM

## 2019-10-29 DIAGNOSIS — Z0000 Encounter for general adult medical examination without abnormal findings: Secondary | ICD-10-CM

## 2019-10-29 DIAGNOSIS — Z01419 Encounter for gynecological examination (general) (routine) without abnormal findings: Secondary | ICD-10-CM

## 2019-10-29 DIAGNOSIS — E559 Vitamin D deficiency, unspecified: Secondary | ICD-10-CM

## 2019-10-29 LAB — POCT URINALYSIS DIPSTICK
Bilirubin, UA: NEGATIVE
Blood, UA: NEGATIVE
Glucose, UA: NEGATIVE
Ketones, UA: NEGATIVE
Leukocytes, UA: NEGATIVE
Nitrite, UA: NEGATIVE
Protein, UA: NEGATIVE
Urobilinogen, UA: NEGATIVE E.U./dL — AB
pH, UA: 6 (ref 5.0–8.0)

## 2019-10-29 MED ORDER — ESTRADIOL 0.1 MG/GM VA CREA: TOPICAL_CREAM | VAGINAL | 1 refills | Status: DC

## 2019-10-29 NOTE — Telephone Encounter (Signed)
Opened in error

## 2019-10-29 NOTE — Patient Instructions (Signed)
EXERCISE AND DIET:  We recommended that you start or continue a regular exercise program for good health. Regular exercise means any activity that makes your heart beat faster and makes you sweat.  We recommend exercising at least 30 minutes per day at least 3 days a week, preferably 4 or 5.  We also recommend a diet low in fat and sugar.  Inactivity, poor dietary choices and obesity can cause diabetes, heart attack, stroke, and kidney damage, among others.    ALCOHOL AND SMOKING:  Women should limit their alcohol intake to no more than 7 drinks/beers/glasses of wine (combined, not each!) per week. Moderation of alcohol intake to this level decreases your risk of breast cancer and liver damage. And of course, no recreational drugs are part of a healthy lifestyle.  And absolutely no smoking or even second hand smoke. Most people know smoking can cause heart and lung diseases, but did you know it also contributes to weakening of your bones? Aging of your skin?  Yellowing of your teeth and nails?  CALCIUM AND VITAMIN D:  Adequate intake of calcium and Vitamin D are recommended.  The recommendations for exact amounts of these supplements seem to change often, but generally speaking 1,200 mg of calcium (between diet and supplement) and 800 units of Vitamin D per day seems prudent. Certain women may benefit from higher intake of Vitamin D.  If you are among these women, your doctor will have told you during your visit.    PAP SMEARS:  Pap smears, to check for cervical cancer or precancers,  have traditionally been done yearly, although recent scientific advances have shown that most women can have pap smears less often.  However, every woman still should have a physical exam from her gynecologist every year. It will include a breast check, inspection of the vulva and vagina to check for abnormal growths or skin changes, a visual exam of the cervix, and then an exam to evaluate the size and shape of the uterus and  ovaries.  And after 63 years of age, a rectal exam is indicated to check for rectal cancers. We will also provide age appropriate advice regarding health maintenance, like when you should have certain vaccines, screening for sexually transmitted diseases, bone density testing, colonoscopy, mammograms, etc.   MAMMOGRAMS:  All women over 40 years old should have a yearly mammogram. Many facilities now offer a "3D" mammogram, which may cost around $50 extra out of pocket. If possible,  we recommend you accept the option to have the 3D mammogram performed.  It both reduces the number of women who will be called back for extra views which then turn out to be normal, and it is better than the routine mammogram at detecting truly abnormal areas.    COLON CANCER SCREENING: Now recommend starting at age 45. At this time colonoscopy is not covered for routine screening until 50. There are take home tests that can be done between 45-49.   COLONOSCOPY:  Colonoscopy to screen for colon cancer is recommended for all women at age 50.  We know, you hate the idea of the prep.  We agree, BUT, having colon cancer and not knowing it is worse!!  Colon cancer so often starts as a polyp that can be seen and removed at colonscopy, which can quite literally save your life!  And if your first colonoscopy is normal and you have no family history of colon cancer, most women don't have to have it again for   10 years.  Once every ten years, you can do something that may end up saving your life, right?  We will be happy to help you get it scheduled when you are ready.  Be sure to check your insurance coverage so you understand how much it will cost.  It may be covered as a preventative service at no cost, but you should check your particular policy.      Breast Self-Awareness Breast self-awareness means being familiar with how your breasts look and feel. It involves checking your breasts regularly and reporting any changes to your  health care provider. Practicing breast self-awareness is important. A change in your breasts can be a sign of a serious medical problem. Being familiar with how your breasts look and feel allows you to find any problems early, when treatment is more likely to be successful. All women should practice breast self-awareness, including women who have had breast implants. How to do a breast self-exam One way to learn what is normal for your breasts and whether your breasts are changing is to do a breast self-exam. To do a breast self-exam: Look for Changes  1. Remove all the clothing above your waist. 2. Stand in front of a mirror in a room with good lighting. 3. Put your hands on your hips. 4. Push your hands firmly downward. 5. Compare your breasts in the mirror. Look for differences between them (asymmetry), such as: ? Differences in shape. ? Differences in size. ? Puckers, dips, and bumps in one breast and not the other. 6. Look at each breast for changes in your skin, such as: ? Redness. ? Scaly areas. 7. Look for changes in your nipples, such as: ? Discharge. ? Bleeding. ? Dimpling. ? Redness. ? A change in position. Feel for Changes Carefully feel your breasts for lumps and changes. It is best to do this while lying on your back on the floor and again while sitting or standing in the shower or tub with soapy water on your skin. Feel each breast in the following way:  Place the arm on the side of the breast you are examining above your head.  Feel your breast with the other hand.  Start in the nipple area and make  inch (2 cm) overlapping circles to feel your breast. Use the pads of your three middle fingers to do this. Apply light pressure, then medium pressure, then firm pressure. The light pressure will allow you to feel the tissue closest to the skin. The medium pressure will allow you to feel the tissue that is a little deeper. The firm pressure will allow you to feel the tissue  close to the ribs.  Continue the overlapping circles, moving downward over the breast until you feel your ribs below your breast.  Move one finger-width toward the center of the body. Continue to use the  inch (2 cm) overlapping circles to feel your breast as you move slowly up toward your collarbone.  Continue the up and down exam using all three pressures until you reach your armpit.  Write Down What You Find  Write down what is normal for each breast and any changes that you find. Keep a written record with breast changes or normal findings for each breast. By writing this information down, you do not need to depend only on memory for size, tenderness, or location. Write down where you are in your menstrual cycle, if you are still menstruating. If you are having trouble noticing differences   in your breasts, do not get discouraged. With time you will become more familiar with the variations in your breasts and more comfortable with the exam. How often should I examine my breasts? Examine your breasts every month. If you are breastfeeding, the best time to examine your breasts is after a feeding or after using a breast pump. If you menstruate, the best time to examine your breasts is 5-7 days after your period is over. During your period, your breasts are lumpier, and it may be more difficult to notice changes. When should I see my health care provider? See your health care provider if you notice:  A change in shape or size of your breasts or nipples.  A change in the skin of your breast or nipples, such as a reddened or scaly area.  Unusual discharge from your nipples.  A lump or thick area that was not there before.  Pain in your breasts.  Anything that concerns you.  

## 2019-10-30 ENCOUNTER — Other Ambulatory Visit (INDEPENDENT_AMBULATORY_CARE_PROVIDER_SITE_OTHER): Payer: BC Managed Care – PPO

## 2019-10-30 ENCOUNTER — Other Ambulatory Visit: Payer: Self-pay | Admitting: *Deleted

## 2019-10-30 DIAGNOSIS — E781 Pure hyperglyceridemia: Secondary | ICD-10-CM

## 2019-10-30 LAB — CYTOLOGY - PAP
Comment: NEGATIVE
Diagnosis: NEGATIVE
High risk HPV: NEGATIVE

## 2019-10-30 LAB — COMPREHENSIVE METABOLIC PANEL
ALT: 13 IU/L (ref 0–32)
AST: 21 IU/L (ref 0–40)
Albumin/Globulin Ratio: 1.1 — ABNORMAL LOW (ref 1.2–2.2)
Albumin: 4 g/dL (ref 3.8–4.8)
Alkaline Phosphatase: 89 IU/L (ref 44–121)
BUN/Creatinine Ratio: 15 (ref 12–28)
BUN: 13 mg/dL (ref 8–27)
Bilirubin Total: <0.2 mg/dL (ref 0.0–1.2)
CO2: 23 mmol/L (ref 20–29)
Calcium: 9 mg/dL (ref 8.7–10.3)
Chloride: 104 mmol/L (ref 96–106)
Creatinine, Ser: 0.87 mg/dL (ref 0.57–1.00)
GFR calc Af Amer: 82 mL/min/{1.73_m2} (ref >59)
GFR calc non Af Amer: 71 mL/min/{1.73_m2} (ref >59)
Globulin, Total: 3.7 g/dL (ref 1.5–4.5)
Glucose: 98 mg/dL (ref 65–99)
Potassium: 4.1 mmol/L (ref 3.5–5.2)
Sodium: 140 mmol/L (ref 134–144)
Total Protein: 7.7 g/dL (ref 6.0–8.5)

## 2019-10-30 LAB — LIPID PANEL
Chol/HDL Ratio: 4.1 ratio (ref 0.0–4.4)
Cholesterol, Total: 174 mg/dL (ref 100–199)
HDL: 42 mg/dL (ref >39)
LDL Chol Calc (NIH): 100 mg/dL — ABNORMAL HIGH (ref 0–99)
Triglycerides: 186 mg/dL — ABNORMAL HIGH (ref 0–149)
VLDL Cholesterol Cal: 32 mg/dL (ref 5–40)

## 2019-10-30 LAB — CBC
Hematocrit: 40.4 % (ref 34.0–46.6)
Hemoglobin: 13.2 g/dL (ref 11.1–15.9)
MCH: 30.6 pg (ref 26.6–33.0)
MCHC: 32.7 g/dL (ref 31.5–35.7)
MCV: 94 fL (ref 79–97)
Platelets: 219 10*3/uL (ref 150–450)
RBC: 4.32 x10E6/uL (ref 3.77–5.28)
RDW: 12.3 % (ref 11.7–15.4)
WBC: 8.1 10*3/uL (ref 3.4–10.8)

## 2019-10-30 LAB — VITAMIN D 25 HYDROXY (VIT D DEFICIENCY, FRACTURES): Vit D, 25-Hydroxy: 44.1 ng/mL (ref 30.0–100.0)

## 2019-10-31 LAB — TRIGLYCERIDES: Triglycerides: 100 mg/dL (ref 0–149)

## 2019-12-03 ENCOUNTER — Ambulatory Visit
Admission: RE | Admit: 2019-12-03 | Discharge: 2019-12-03 | Disposition: A | Discharge status: Home / Self Care | Payer: BC Managed Care – PPO | Source: Ambulatory Visit | Attending: Family Medicine | Admitting: Family Medicine

## 2019-12-03 ENCOUNTER — Other Ambulatory Visit: Payer: Self-pay

## 2019-12-03 DIAGNOSIS — Z1231 Encounter for screening mammogram for malignant neoplasm of breast: Secondary | ICD-10-CM

## 2020-01-27 IMAGING — MG DIGITAL SCREENING BILAT W/ CAD
4 series · 4 of 4 positions shown · non-contrast
Comparison: Previous exam(s).

CLINICAL DATA: Screening.

EXAM:
DIGITAL SCREENING BILATERAL MAMMOGRAM WITH CAD

[L CC]
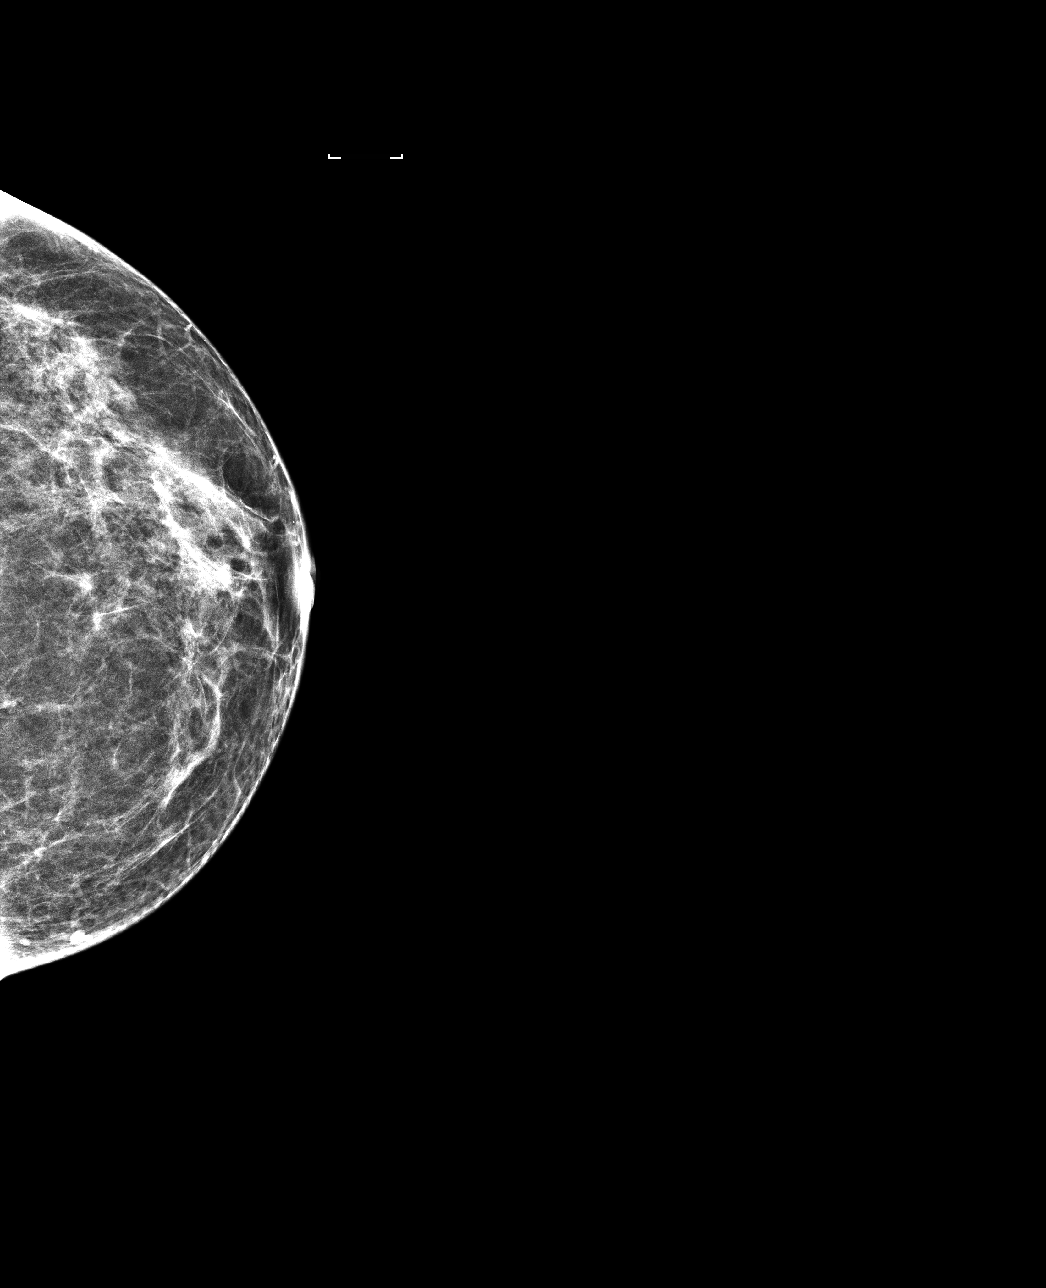

[R CC]
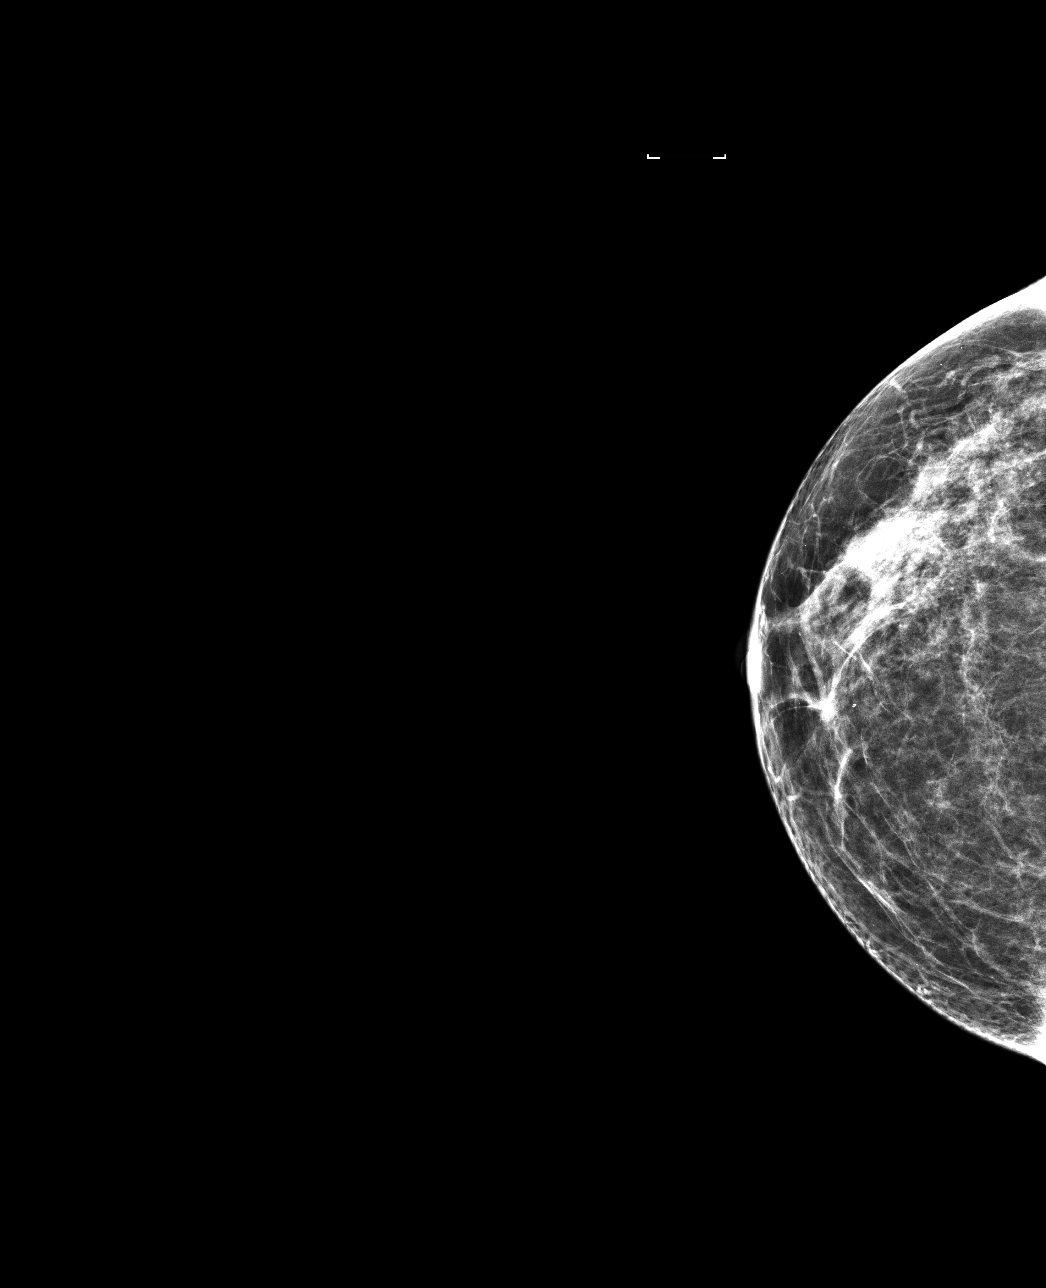

[L MLO]
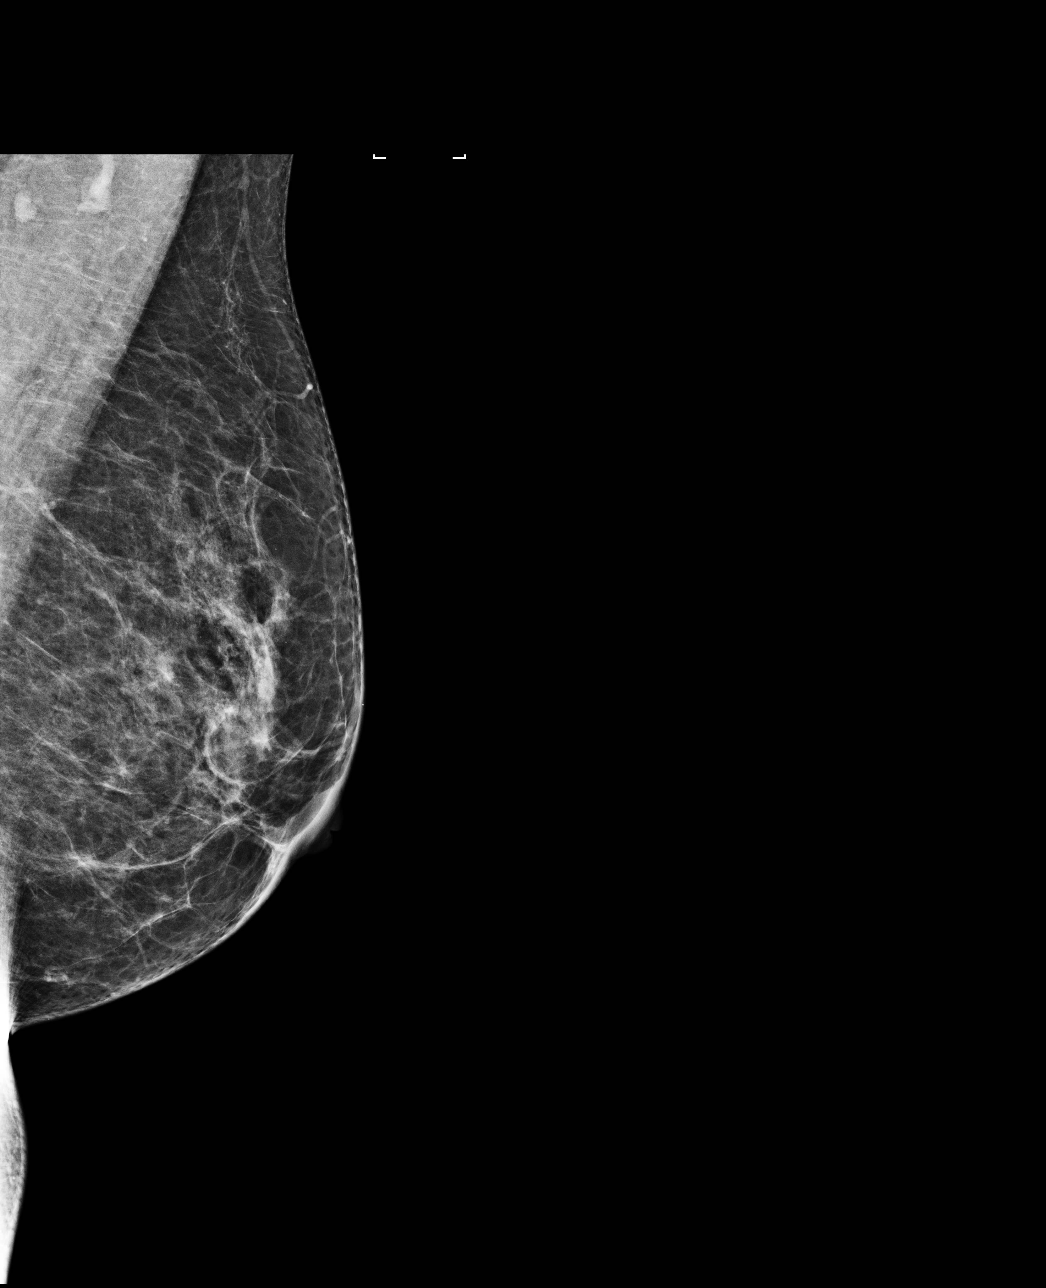

[R MLO]
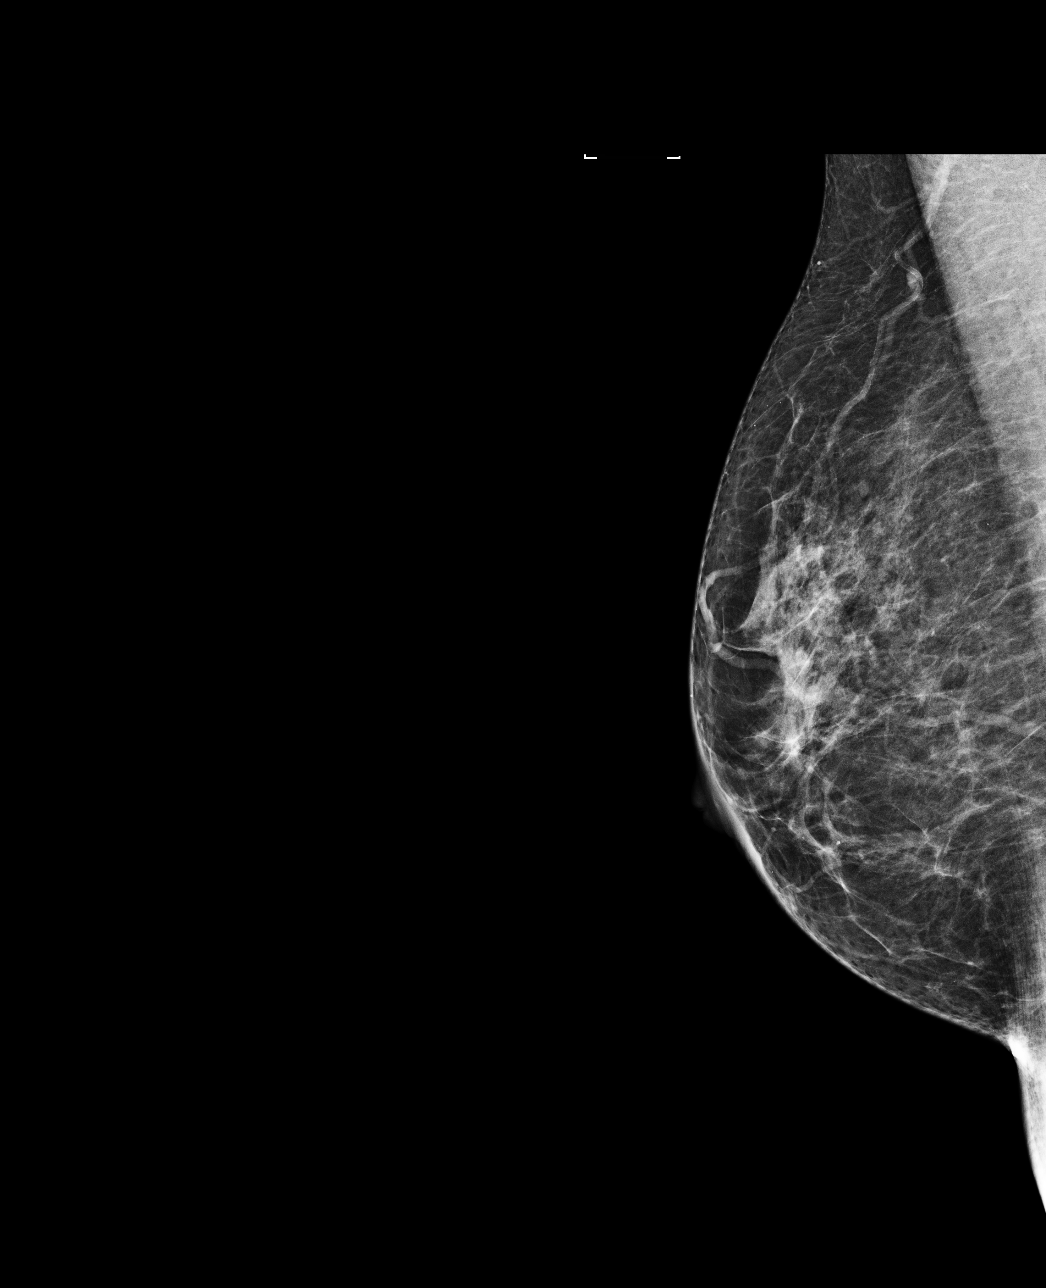

[4 of 4 positions shown; findings below may reference images not displayed]

ACR Breast Density Category b: There are scattered areas of
fibroglandular density.
FINDINGS: There are no findings suspicious for malignancy. Images were
processed with CAD.
IMPRESSION: No mammographic evidence of malignancy. A result letter of this
screening mammogram will be mailed directly to the patient.

RECOMMENDATION:
Screening mammogram in one year. (Code:AS-G-LCT)

BI-RADS CATEGORY  1: Negative.

## 2020-11-01 ENCOUNTER — Ambulatory Visit: Payer: BC Managed Care – PPO | Admitting: Obstetrics and Gynecology

## 2020-12-13 ENCOUNTER — Other Ambulatory Visit: Payer: Self-pay | Admitting: Family Medicine

## 2020-12-13 DIAGNOSIS — Z1231 Encounter for screening mammogram for malignant neoplasm of breast: Secondary | ICD-10-CM

## 2021-03-07 NOTE — Progress Notes (Signed)
66 y.o. [id number] Married Asian Not Hispanic or Latino female here for breast and pelvic exam.   No vaginal bleeding. Stopped her vaginal estrogen. Sexually active, mild discomfort (no help with the cream). No help with lubricant.   She has mild GSI with cough or laugh. Does kegels.   No bowel c/o.  She has recently treated herself with an OTC antifungal agent.     No LMP recorded. Patient is postmenopausal.          Sexually active: Yes.    The current method of family planning is post menopausal status.    Exercising: Yes.     Swimming  Smoker:  no  Health Maintenance: Pap:   10/29/19 pap wnl, neg hpv; 10/29/18 WNL Hr HPV Neg, 2016 WNL  History of abnormal Pap:  no MMG: Done this morning.  12/03/19 density B Bi-rads 1 neg  BMD:   10/27/2016 Osteopenia, T score -1.3, FRAX 3.7/0.3% Colonoscopy: 07/18/19 f/u 10 years  TDaP:  09/09/14  Gardasil: n/a   reports that she has never smoked. She has never used smokeless tobacco. She reports that she does not drink alcohol and does not use drugs. She retired last year. 3 kids, 7 grandchildren, one on the way.    Past Medical History:  Diagnosis Date   Allergy    Cough    Osteopenia    mild   Seasonal allergies    Vitamin D deficiency     Past Surgical History:  Procedure Laterality Date   COLONOSCOPY  2011   HPP X 1   POLYPECTOMY     HPP x 1 2011    Current Outpatient Medications  Medication Sig Dispense Refill   Ascorbic Acid (VITAMIN C) 1000 MG tablet Take 1,000 mg by mouth daily.     Calcium Carbonate-Vitamin D (CALCIUM 600+D PO) Take by mouth.     clobetasol ointment (TEMOVATE) 0.05 %      Multiple Vitamin (MULTIVITAMIN) tablet Take 1 tablet by mouth daily.     Current Facility-Administered Medications  Medication Dose Route Frequency Provider Last Rate Last Admin   0.9 %  sodium chloride infusion  500 mL Intravenous Once Armbruster, Willaim Rayas, MD       0.9 %  sodium chloride infusion  500 mL Intravenous Once Armbruster,  Willaim Rayas, MD        Family History  Problem Relation Age of Onset   Alzheimer's disease Mother    Alzheimer's disease Father    Kidney disease Brother    Colon polyps Brother    Heart attack Brother 20   Breast cancer Neg Hx    Colon cancer Neg Hx    Esophageal cancer Neg Hx    Rectal cancer Neg Hx    Stomach cancer Neg Hx     Review of Systems  All other systems reviewed and are negative.  Exam:   BP 110/64    Pulse 67    Ht 5\' 6"  (1.676 m)    Wt 128 lb (58.1 kg)    SpO2 100%    BMI 20.66 kg/m   Weight change: @WEIGHTCHANGE @ Height:   Height: 5\' 6"  (167.6 cm)  Ht Readings from Last 3 Encounters:  03/09/21 5\' 6"  (1.676 m)  10/29/19 5\' 6"  (1.676 m)  07/18/19 5\' 6"  (1.676 m)    General appearance: alert, cooperative and appears stated age Head: Normocephalic, without obvious abnormality, atraumatic Neck: no adenopathy, supple, symmetrical, trachea midline and thyroid normal to inspection and palpation Breasts:  normal appearance, no masses or tenderness Abdomen: soft, non-tender; non distended,  no masses,  no organomegaly Extremities: extremities normal, atraumatic, no cyanosis or edema Skin: Skin color, texture, turgor normal. No rashes or lesions Lymph nodes: Cervical, supraclavicular, and axillary nodes normal. No abnormal inguinal nodes palpated Neurologic: Grossly normal   Pelvic: External genitalia:  no lesions              Urethra:  normal appearing urethra with no masses, tenderness or lesions              Bartholins and Skenes: normal                 Vagina: normal appearing vagina with normal color and discharge, no lesions              Cervix: no lesions               Bimanual Exam:  Uterus:  normal size, contour, position, consistency, mobility, non-tender              Adnexa: no mass, fullness, tenderness               Rectovaginal: Confirms               Anus:  normal sphincter tone, no lesions  Carolynn Serve chaperoned for the exam.  1. Gynecologic  exam normal Mammogram this am No pap this year Colonoscopy UTD   2. History of osteopenia Discussed calcium and vit d Discussed exercise Discussed balance and decreasing her risk of falls - DG Bone Density; Future  3. Vaginal atrophy Mild discomfort with intercourse, she stopped vaginal estrogen because it wasn't helping -Try uberlube for vaginal lubrication   4. Hypoestrogenism - DG Bone Density; Future

## 2021-03-09 ENCOUNTER — Ambulatory Visit (INDEPENDENT_AMBULATORY_CARE_PROVIDER_SITE_OTHER): Payer: Medicare Other | Admitting: Obstetrics and Gynecology

## 2021-03-09 ENCOUNTER — Ambulatory Visit
Admission: RE | Admit: 2021-03-09 | Discharge: 2021-03-09 | Disposition: A | Discharge status: Home / Self Care | Payer: Medicare Other | Source: Ambulatory Visit | Attending: Family Medicine | Admitting: Family Medicine

## 2021-03-09 ENCOUNTER — Encounter: Payer: Self-pay | Admitting: Obstetrics and Gynecology

## 2021-03-09 ENCOUNTER — Other Ambulatory Visit: Payer: Self-pay

## 2021-03-09 VITALS — BP 110/64 | HR 67 | Ht 66.0 in | Wt 128.0 lb

## 2021-03-09 DIAGNOSIS — Z01419 Encounter for gynecological examination (general) (routine) without abnormal findings: Secondary | ICD-10-CM

## 2021-03-09 DIAGNOSIS — E2839 Other primary ovarian failure: Secondary | ICD-10-CM

## 2021-03-09 DIAGNOSIS — N952 Postmenopausal atrophic vaginitis: Secondary | ICD-10-CM | POA: Diagnosis not present

## 2021-03-09 DIAGNOSIS — Z8739 Personal history of other diseases of the musculoskeletal system and connective tissue: Secondary | ICD-10-CM | POA: Diagnosis not present

## 2021-03-09 DIAGNOSIS — Z1231 Encounter for screening mammogram for malignant neoplasm of breast: Secondary | ICD-10-CM

## 2021-03-09 NOTE — Patient Instructions (Addendum)

## 2021-03-11 ENCOUNTER — Other Ambulatory Visit: Payer: Self-pay | Admitting: Family Medicine

## 2021-03-11 DIAGNOSIS — R928 Other abnormal and inconclusive findings on diagnostic imaging of breast: Secondary | ICD-10-CM

## 2021-03-31 ENCOUNTER — Ambulatory Visit
Admission: RE | Admit: 2021-03-31 | Discharge: 2021-03-31 | Disposition: A | Discharge status: Home / Self Care | Payer: Medicare Other | Source: Ambulatory Visit | Attending: Family Medicine | Admitting: Family Medicine

## 2021-03-31 ENCOUNTER — Ambulatory Visit: Payer: Medicare Other

## 2021-03-31 ENCOUNTER — Other Ambulatory Visit: Payer: Self-pay

## 2021-03-31 DIAGNOSIS — R928 Other abnormal and inconclusive findings on diagnostic imaging of breast: Secondary | ICD-10-CM

## 2021-04-08 ENCOUNTER — Other Ambulatory Visit: Payer: Medicare Other
